# Patient Record
Sex: Female | Born: 1990 | Race: White | Hispanic: No | Marital: Single | Smoking: Never smoker
Health system: Southern US, Community
[De-identification: ages and names within clinical notes are randomized; demographics above are authoritative.]

## PROBLEM LIST (undated history)

## (undated) DIAGNOSIS — Z789 Other specified health status: Secondary | ICD-10-CM

## (undated) HISTORY — DX: Other specified health status: Z78.9

## (undated) HISTORY — PX: NO PAST SURGERIES: SHX2092

---
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
## 2011-01-29 ENCOUNTER — Emergency Department (HOSPITAL_COMMUNITY)
Admission: EM | Admit: 2011-01-29 | Discharge: 2011-01-29 | Disposition: A | Discharge status: Home / Self Care | Payer: Self-pay | Attending: Emergency Medicine | Admitting: Emergency Medicine

## 2011-01-29 DIAGNOSIS — H5710 Ocular pain, unspecified eye: Secondary | ICD-10-CM | POA: Insufficient documentation

## 2011-01-29 DIAGNOSIS — Y92009 Unspecified place in unspecified non-institutional (private) residence as the place of occurrence of the external cause: Secondary | ICD-10-CM | POA: Insufficient documentation

## 2011-01-29 DIAGNOSIS — IMO0002 Reserved for concepts with insufficient information to code with codable children: Secondary | ICD-10-CM | POA: Insufficient documentation

## 2011-01-29 DIAGNOSIS — S058X9A Other injuries of unspecified eye and orbit, initial encounter: Secondary | ICD-10-CM | POA: Insufficient documentation

## 2013-05-17 ENCOUNTER — Other Ambulatory Visit: Payer: Self-pay | Admitting: *Deleted

## 2013-05-17 ENCOUNTER — Encounter: Payer: Self-pay | Admitting: Advanced Practice Midwife

## 2013-05-17 ENCOUNTER — Ambulatory Visit (INDEPENDENT_AMBULATORY_CARE_PROVIDER_SITE_OTHER): Payer: Medicaid Other | Admitting: Advanced Practice Midwife

## 2013-05-17 VITALS — BP 113/74 | Temp 98.7°F | Ht 61.0 in | Wt 118.2 lb

## 2013-05-17 DIAGNOSIS — Z3400 Encounter for supervision of normal first pregnancy, unspecified trimester: Secondary | ICD-10-CM | POA: Insufficient documentation

## 2013-05-17 DIAGNOSIS — Z3401 Encounter for supervision of normal first pregnancy, first trimester: Secondary | ICD-10-CM

## 2013-05-17 DIAGNOSIS — Z3201 Encounter for pregnancy test, result positive: Secondary | ICD-10-CM

## 2013-05-17 LAB — POCT URINALYSIS DIP (DEVICE)
Bilirubin Urine: NEGATIVE
Ketones, ur: NEGATIVE mg/dL
pH: 7 (ref 5.0–8.0)

## 2013-05-17 LAB — US OB LIMITED

## 2013-05-17 NOTE — Patient Instructions (Signed)
Pregnancy - First Trimester  During sexual intercourse, millions of sperm go into the vagina. Only 1 sperm will penetrate and fertilize the female egg while it is in the Fallopian tube. One week later, the fertilized egg implants into the wall of the uterus. An embryo begins to develop into a baby. At 6 to 8 weeks, the eyes and face are formed and the heartbeat can be seen on ultrasound. At the end of 12 weeks (first trimester), all the baby's organs are formed. Now that you are pregnant, you will want to do everything you can to have a healthy baby. Two of the most important things are to get good prenatal care and follow your caregiver's instructions. Prenatal care is all the medical care you receive before the baby's birth. It is given to prevent, find, and treat problems during the pregnancy and childbirth.  PRENATAL EXAMS  · During prenatal visits, your weight, blood pressure, and urine are checked. This is done to make sure you are healthy and progressing normally during the pregnancy.  · A pregnant woman should gain 25 to 35 pounds during the pregnancy. However, if you are overweight or underweight, your caregiver will advise you regarding your weight.  · Your caregiver will ask and answer questions for you.  · Blood work, cervical cultures, other necessary tests, and a Pap test are done during your prenatal exams. These tests are done to check on your health and the probable health of your baby. Tests are strongly recommended and done for HIV with your permission. This is the virus that causes AIDS. These tests are done because medicines can be given to help prevent your baby from being born with this infection should you have been infected without knowing it. Blood work is also used to find out your blood type, previous infections, and follow your blood levels (hemoglobin).  · Low hemoglobin (anemia) is common during pregnancy. Iron and vitamins are given to help prevent this. Later in the pregnancy, blood  tests for diabetes will be done along with any other tests if any problems develop.  · You may need other tests to make sure you and the baby are doing well.  CHANGES DURING THE FIRST TRIMESTER   Your body goes through many changes during pregnancy. They vary from person to person. Talk to your caregiver about changes you notice and are concerned about. Changes can include:  · Your menstrual period stops.  · The egg and sperm carry the genes that determine what you look like. Genes from you and your partner are forming a baby. The female genes determine whether the baby is a boy or a girl.  · Your body increases in girth and you may feel bloated.  · Feeling sick to your stomach (nauseous) and throwing up (vomiting). If the vomiting is uncontrollable, call your caregiver.  · Your breasts will begin to enlarge and become tender.  · Your nipples may stick out more and become darker.  · The need to urinate more. Painful urination may mean you have a bladder infection.  · Tiring easily.  · Loss of appetite.  · Cravings for certain kinds of food.  · At first, you may gain or lose a couple of pounds.  · You may have changes in your emotions from day to day (excited to be pregnant or concerned something may go wrong with the pregnancy and baby).  · You may have more vivid and strange dreams.  HOME CARE INSTRUCTIONS   ·   It is very important to avoid all smoking, alcohol and non-prescribed drugs during your pregnancy. These affect the formation and growth of the baby. Avoid chemicals while pregnant to ensure the delivery of a healthy infant.  · Start your prenatal visits by the 12th week of pregnancy. They are usually scheduled monthly at first, then more often in the last 2 months before delivery. Keep your caregiver's appointments. Follow your caregiver's instructions regarding medicine use, blood and lab tests, exercise, and diet.  · During pregnancy, you are providing food for you and your baby. Eat regular, well-balanced  meals. Choose foods such as meat, fish, milk and other low fat dairy products, vegetables, fruits, and whole-grain breads and cereals. Your caregiver will tell you of the ideal weight gain.  · You can help morning sickness by keeping soda crackers at the bedside. Eat a couple before arising in the morning. You may want to use the crackers without salt on them.  · Eating 4 to 5 small meals rather than 3 large meals a day also may help the nausea and vomiting.  · Drinking liquids between meals instead of during meals also seems to help nausea and vomiting.  · A physical sexual relationship may be continued throughout pregnancy if there are no other problems. Problems may be early (premature) leaking of amniotic fluid from the membranes, vaginal bleeding, or belly (abdominal) pain.  · Exercise regularly if there are no restrictions. Check with your caregiver or physical therapist if you are unsure of the safety of some of your exercises. Greater weight gain will occur in the last 2 trimesters of pregnancy. Exercising will help:  · Control your weight.  · Keep you in shape.  · Prepare you for labor and delivery.  · Help you lose your pregnancy weight after you deliver your baby.  · Wear a good support or jogging bra for breast tenderness during pregnancy. This may help if worn during sleep too.  · Ask when prenatal classes are available. Begin classes when they are offered.  · Do not use hot tubs, steam rooms, or saunas.  · Wear your seat belt when driving. This protects you and your baby if you are in an accident.  · Avoid raw meat, uncooked cheese, cat litter boxes, and soil used by cats throughout the pregnancy. These carry germs that can cause birth defects in the baby.  · The first trimester is a good time to visit your dentist for your dental health. Getting your teeth cleaned is okay. Use a softer toothbrush and brush gently during pregnancy.  · Ask for help if you have financial, counseling, or nutritional needs  during pregnancy. Your caregiver will be able to offer counseling for these needs as well as refer you for other special needs.  · Do not take any medicines or herbs unless told by your caregiver.  · Inform your caregiver if there is any mental or physical domestic violence.  · Make a list of emergency phone numbers of family, friends, hospital, and police and fire departments.  · Write down your questions. Take them to your prenatal visit.  · Do not douche.  · Do not cross your legs.  · If you have to stand for long periods of time, rotate you feet or take small steps in a circle.  · You may have more vaginal secretions that may require a sanitary pad. Do not use tampons or scented sanitary pads.  MEDICINES AND DRUG USE IN PREGNANCY  ·   Take prenatal vitamins as directed. The vitamin should contain 1 milligram of folic acid. Keep all vitamins out of reach of children. Only a couple vitamins or tablets containing iron may be fatal to a baby or young child when ingested.  · Avoid use of all medicines, including herbs, over-the-counter medicines, not prescribed or suggested by your caregiver. Only take over-the-counter or prescription medicines for pain, discomfort, or fever as directed by your caregiver. Do not use aspirin, ibuprofen, or naproxen unless directed by your caregiver.  · Let your caregiver also know about herbs you may be using.  · Alcohol is related to a number of birth defects. This includes fetal alcohol syndrome. All alcohol, in any form, should be avoided completely. Smoking will cause low birth rate and premature babies.  · Street or illegal drugs are very harmful to the baby. They are absolutely forbidden. A baby born to an addicted mother will be addicted at birth. The baby will go through the same withdrawal an adult does.  · Let your caregiver know about any medicines that you have to take and for what reason you take them.  SEEK MEDICAL CARE IF:   You have any concerns or worries during your  pregnancy. It is better to call with your questions if you feel they cannot wait, rather than worry about them.  SEEK IMMEDIATE MEDICAL CARE IF:   · An unexplained oral temperature above 102° F (38.9° C) develops, or as your caregiver suggests.  · You have leaking of fluid from the vagina (birth canal). If leaking membranes are suspected, take your temperature and inform your caregiver of this when you call.  · There is vaginal spotting or bleeding. Notify your caregiver of the amount and how many pads are used.  · You develop a bad smelling vaginal discharge with a change in the color.  · You continue to feel sick to your stomach (nauseated) and have no relief from remedies suggested. You vomit blood or coffee ground-like materials.  · You lose more than 2 pounds of weight in 1 week.  · You gain more than 2 pounds of weight in 1 week and you notice swelling of your face, hands, feet, or legs.  · You gain 5 pounds or more in 1 week (even if you do not have swelling of your hands, face, legs, or feet).  · You get exposed to German measles and have never had them.  · You are exposed to fifth disease or chickenpox.  · You develop belly (abdominal) pain. Round ligament discomfort is a common non-cancerous (benign) cause of abdominal pain in pregnancy. Your caregiver still must evaluate this.  · You develop headache, fever, diarrhea, pain with urination, or shortness of breath.  · You fall or are in a car accident or have any kind of trauma.  · There is mental or physical violence in your home.  Document Released: 10/18/2001 Document Revised: 07/18/2012 Document Reviewed: 04/21/2009  [employer]® Patient Information ©2014 [employer].

## 2013-05-17 NOTE — Progress Notes (Signed)
Pulse: 74 Discussed recommended weight gain of 25-35 lb.

## 2013-05-17 NOTE — Progress Notes (Signed)
Informal [place] for viability- +FM, +cardiac, FHR = 173 per PW doppler.  First screen scheduled 7/23.

## 2013-05-17 NOTE — Progress Notes (Signed)
   Subjective:    Kelly Collier is a G1P0 [redacted]w[redacted]d being seen today for her first obstetrical visit.  Her obstetrical history is significant for unplanned pregnancy. Patient does intend to breast feed. Pregnancy history fully reviewed.  Patient reports no complaints.  Filed Vitals:   05/17/13 0835 05/17/13 0844  BP: 113/74   Temp: 98.7 F (37.1 C)   Height:  5\' 1"  (1.549 m)  Weight: 53.615 kg (118 lb 3.2 oz)     HISTORY: OB History   Grav Para Term Preterm Abortions TAB SAB Ect Mult Living   1              # Outc Date GA Lbr Len/2nd Wgt Sex Del Anes PTL Lv   1 CUR              Past Medical History  Diagnosis Date  . Medical history non-contributory    Past Surgical History  Procedure Laterality Date  . No past surgeries     Family History  Problem Relation Age of Onset  . Diabetes Father   . Heart disease Father   . Heart disease Brother      Exam    Uterus:     Pelvic Exam:    Perineum: No Hemorrhoids   Vulva: Bartholin's, Urethra, Skene's normal   Vagina:  normal mucosa, normal discharge   pH:    Cervix: no bleeding following Pap, no cervical motion tenderness, no lesions and nulliparous appearance   Adnexa: normal adnexa and no mass, fullness, tenderness   Bony Pelvis: gynecoid  System: Breast:  normal appearance, no masses or tenderness   Skin: normal coloration and turgor, no rashes    Neurologic: oriented, normal, grossly non-focal   Extremities: normal strength, tone, and muscle mass   HEENT neck supple with midline trachea   Mouth/Teeth mucous membranes moist, pharynx normal without lesions   Neck supple and no masses   Cardiovascular: regular rate and rhythm   Respiratory:  appears well, vitals normal, no respiratory distress, acyanotic, normal RR, ear and throat exam is normal, neck free of mass or lymphadenopathy, chest clear, no wheezing, crepitations, rhonchi, normal symmetric air entry   Abdomen: soft, non-tender; bowel sounds normal; no  masses,  no organomegaly   Urinary: urethral meatus normal      Assessment:    Pregnancy: G1P0 There are no active problems to display for this patient.       Plan:     Initial labs drawn. Prenatal vitamins. Problem list reviewed and updated. Genetic Screening discussed First Screen: ordered.  Ultrasound discussed; fetal survey: ordered.  Follow up in 4 weeks. 50% of 30 min visit spent on counseling and coordination of care.  Unable to hear FHTs so US done to verify viability and FHR was seen   [facility] 05/17/2013

## 2013-05-18 ENCOUNTER — Encounter: Payer: Self-pay | Admitting: Obstetrics & Gynecology

## 2013-05-18 LAB — OBSTETRIC PANEL
Antibody Screen: NEGATIVE
Basophils Absolute: 0.1 10*3/uL (ref 0.0–0.1)
Basophils Relative: 1 % (ref 0–1)
HCT: 33.4 % — ABNORMAL LOW (ref 36.0–46.0)
Hemoglobin: 11.4 g/dL — ABNORMAL LOW (ref 12.0–15.0)
MCHC: 34.1 g/dL (ref 30.0–36.0)
Monocytes Absolute: 0.6 10*3/uL (ref 0.1–1.0)
Neutro Abs: 8.9 10*3/uL — ABNORMAL HIGH (ref 1.7–7.7)
Neutrophils Relative %: 77 % (ref 43–77)
Platelets: 215 10*3/uL (ref 150–400)
RBC: 3.78 MIL/uL — ABNORMAL LOW (ref 3.87–5.11)
RDW: 12.8 % (ref 11.5–15.5)
WBC: 11.4 10*3/uL — ABNORMAL HIGH (ref 4.0–10.5)

## 2013-05-18 LAB — CULTURE, OB URINE: Colony Count: 4000

## 2013-05-18 LAB — HIV ANTIBODY (ROUTINE TESTING W REFLEX): HIV: NONREACTIVE

## 2013-05-27 ENCOUNTER — Encounter: Payer: Self-pay | Admitting: Advanced Practice Midwife

## 2013-05-27 DIAGNOSIS — IMO0002 Reserved for concepts with insufficient information to code with codable children: Secondary | ICD-10-CM | POA: Insufficient documentation

## 2013-05-28 ENCOUNTER — Other Ambulatory Visit: Payer: Self-pay | Admitting: Advanced Practice Midwife

## 2013-05-28 DIAGNOSIS — Z3682 Encounter for antenatal screening for nuchal translucency: Secondary | ICD-10-CM

## 2013-05-29 ENCOUNTER — Telehealth: Payer: Self-pay | Admitting: General Practice

## 2013-05-29 ENCOUNTER — Other Ambulatory Visit: Payer: Self-pay

## 2013-05-29 ENCOUNTER — Ambulatory Visit (HOSPITAL_COMMUNITY)
Admission: RE | Admit: 2013-05-29 | Discharge: 2013-05-29 | Disposition: A | Discharge status: Home / Self Care | Payer: Medicaid Other | Source: Ambulatory Visit | Attending: Advanced Practice Midwife | Admitting: Advanced Practice Midwife

## 2013-05-29 DIAGNOSIS — O3510X0 Maternal care for (suspected) chromosomal abnormality in fetus, unspecified, not applicable or unspecified: Secondary | ICD-10-CM | POA: Insufficient documentation

## 2013-05-29 DIAGNOSIS — Z3689 Encounter for other specified antenatal screening: Secondary | ICD-10-CM | POA: Insufficient documentation

## 2013-05-29 DIAGNOSIS — O351XX0 Maternal care for (suspected) chromosomal abnormality in fetus, not applicable or unspecified: Secondary | ICD-10-CM | POA: Insufficient documentation

## 2013-05-29 DIAGNOSIS — Z3682 Encounter for antenatal screening for nuchal translucency: Secondary | ICD-10-CM

## 2013-05-29 NOTE — Telephone Encounter (Addendum)
Called patient no answer- left message to call us back at the clinics  **Colpo added to next Ob fu appt on 8/4 @ 0915.   Diane Day RNC

## 2013-05-29 NOTE — Progress Notes (Signed)
[facility] ultrasound  Indication: 23 yr old G1P0 at [redacted]w[redacted]d for first trimester screen.  Findings: 1. Single intrauterine pregnancy. 2. Fetal crown rump length is consistent with dating. 3. Normal uterus; no adnexal masses seen. 4. Evaluation of fetal anatomy is limited by early gestational age. 5. Normal nuchal translucency measuring 1.40mm. 6. The nasal bone is visualized.  Recommendations: 1. First trimester screen done today: discussed limitations in detecting fetal aneuploidy. 2. Recommend maternal serum AFP at 15-[redacted] weeks gestation. 3. Recommend fetal anatomic survey at 18-[redacted] weeks gestation.  Eulis Foster, MD

## 2013-05-29 NOTE — Telephone Encounter (Signed)
Message copied by Kathee Delton on Wed May 29, 2013  5:18 PM ------      Message from: [place], [place] L      Created: Mon May 27, 2013  8:26 AM      Regarding: abnormal pap       ASCUS cannot exclude High Grade            Needs colpo            HR HPV +            Can you give her results and schedule colpo?            Thanks      marie ------

## 2013-05-30 NOTE — Telephone Encounter (Signed)
Spoke to patient, gave pap result and that she will be needing a colposocpy. Procedure added on next OB appt on the 06/10/13 @0915 . Patient agrees and satisfied.

## 2013-05-31 ENCOUNTER — Encounter: Payer: Self-pay | Admitting: Advanced Practice Midwife

## 2013-06-06 ENCOUNTER — Encounter: Payer: Self-pay | Admitting: *Deleted

## 2013-06-10 ENCOUNTER — Ambulatory Visit (INDEPENDENT_AMBULATORY_CARE_PROVIDER_SITE_OTHER): Payer: Medicaid Other | Admitting: Family Medicine

## 2013-06-10 ENCOUNTER — Encounter: Payer: Self-pay | Admitting: Family Medicine

## 2013-06-10 VITALS — BP 134/88 | Temp 97.8°F | Wt 119.5 lb

## 2013-06-10 DIAGNOSIS — Z3400 Encounter for supervision of normal first pregnancy, unspecified trimester: Secondary | ICD-10-CM

## 2013-06-10 DIAGNOSIS — IMO0002 Reserved for concepts with insufficient information to code with codable children: Secondary | ICD-10-CM

## 2013-06-10 DIAGNOSIS — R87810 Cervical high risk human papillomavirus (HPV) DNA test positive: Secondary | ICD-10-CM

## 2013-06-10 DIAGNOSIS — Z3401 Encounter for supervision of normal first pregnancy, first trimester: Secondary | ICD-10-CM

## 2013-06-10 DIAGNOSIS — R87610 Atypical squamous cells of undetermined significance on cytologic smear of cervix (ASC-US): Secondary | ICD-10-CM

## 2013-06-10 LAB — POCT URINALYSIS DIP (DEVICE)
Bilirubin Urine: NEGATIVE
Glucose, UA: 100 mg/dL — AB
Ketones, ur: NEGATIVE mg/dL
Nitrite: NEGATIVE
pH: 5.5 (ref 5.0–8.0)

## 2013-06-10 NOTE — Progress Notes (Signed)
Pulse: 99 

## 2013-06-10 NOTE — Progress Notes (Signed)
22 yo G1 @ [redacted]w[redacted]d - doing well. No complaints - no nausea, ctx, vb, lof.  - scheduled today for colpo   O: see above  A/P:   1) ROB - doing well - symptoms improving - discussed return precautions and visit schedule at this stage of pregnancy.  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  colpo not indicated. This was discussed with the pt.    3) f/u in 4 weeks.

## 2013-06-10 NOTE — Patient Instructions (Addendum)
Pregnancy - Second Trimester The second trimester of pregnancy (3 to 6 months) is a period of rapid growth for you and your baby. At the end of the sixth month, your baby is about 9 inches long and weighs 1 1/2 pounds. You will begin to feel the baby move between 18 and 20 weeks of the pregnancy. This is called quickening. Weight gain is faster. A clear fluid (colostrum) may leak out of your breasts. You may feel small contractions of the womb (uterus). This is known as false labor or Braxton-Hicks contractions. This is like a practice for labor when the baby is ready to be born. Usually, the problems with morning sickness have usually passed by the end of your first trimester. Some women develop small dark blotches (called cholasma, mask of pregnancy) on their face that usually goes away after the baby is born. Exposure to the sun makes the blotches worse. Acne may also develop in some pregnant women and pregnant women who have acne, may find that it goes away. PRENATAL EXAMS  Blood work may continue to be done during prenatal exams. These tests are done to check on your health and the probable health of your baby. Blood work is used to follow your blood levels (hemoglobin). Anemia (low hemoglobin) is common during pregnancy. Iron and vitamins are given to help prevent this. You will also be checked for diabetes between 24 and 28 weeks of the pregnancy. Some of the previous blood tests may be repeated.  The size of the uterus is measured during each visit. This is to make sure that the baby is continuing to grow properly according to the dates of the pregnancy.  Your blood pressure is checked every prenatal visit. This is to make sure you are not getting toxemia.  Your urine is checked to make sure you do not have an infection, diabetes or protein in the urine.  Your weight is checked often to make sure gains are happening at the suggested rate. This is to ensure that both you and your baby are  growing normally.  Sometimes, an ultrasound is performed to confirm the proper growth and development of the baby. This is a test which bounces harmless sound waves off the baby so your caregiver can more accurately determine due dates. Sometimes, a test is done on the amniotic fluid surrounding the baby. This test is called an amniocentesis. The amniotic fluid is obtained by sticking a needle into the belly (abdomen). This is done to check the chromosomes in instances where there is a concern about possible genetic problems with the baby. It is also sometimes done near the end of pregnancy if an early delivery is required. In this case, it is done to help make sure the baby's lungs are mature enough for the baby to live outside of the womb. CHANGES OCCURING IN THE SECOND TRIMESTER OF PREGNANCY Your body goes through many changes during pregnancy. They vary from person to person. Talk to your caregiver about changes you notice that you are concerned about.  During the second trimester, you will likely have an increase in your appetite. It is normal to have cravings for certain foods. This varies from person to person and pregnancy to pregnancy.  Your lower abdomen will begin to bulge.  You may have to urinate more often because the uterus and baby are pressing on your bladder. It is also common to get more bladder infections during pregnancy. You can help this by drinking lots of fluids   and emptying your bladder before and after intercourse.  You may begin to get stretch marks on your hips, abdomen, and breasts. These are normal changes in the body during pregnancy. There are no exercises or medicines to take that prevent this change.  You may begin to develop swollen and bulging veins (varicose veins) in your legs. Wearing support hose, elevating your feet for 15 minutes, 3 to 4 times a day and limiting salt in your diet helps lessen the problem.  Heartburn may develop as the uterus grows and  pushes up against the stomach. Antacids recommended by your caregiver helps with this problem. Also, eating smaller meals 4 to 5 times a day helps.  Constipation can be treated with a stool softener or adding bulk to your diet. Drinking lots of fluids, and eating vegetables, fruits, and whole grains are helpful.  Exercising is also helpful. If you have been very active up until your pregnancy, most of these activities can be continued during your pregnancy. If you have been less active, it is helpful to start an exercise program such as walking.  Hemorrhoids may develop at the end of the second trimester. Warm sitz baths and hemorrhoid cream recommended by your caregiver helps hemorrhoid problems.  Backaches may develop during this time of your pregnancy. Avoid heavy lifting, wear low heal shoes, and practice good posture to help with backache problems.  Some pregnant women develop tingling and numbness of their hand and fingers because of swelling and tightening of ligaments in the wrist (carpel tunnel syndrome). This goes away after the baby is born.  As your breasts enlarge, you may have to get a bigger bra. Get a comfortable, cotton, support bra. Do not get a nursing bra until the last month of the pregnancy if you will be nursing the baby.  You may get a dark line from your belly button to the pubic area called the linea nigra.  You may develop rosy cheeks because of increase blood flow to the face.  You may develop spider looking lines of the face, neck, arms, and chest. These go away after the baby is born. HOME CARE INSTRUCTIONS   It is extremely important to avoid all smoking, herbs, alcohol, and unprescribed drugs during your pregnancy. These chemicals affect the formation and growth of the baby. Avoid these chemicals throughout the pregnancy to ensure the delivery of a healthy infant.  Most of your home care instructions are the same as suggested for the first trimester of your  pregnancy. Keep your caregiver's appointments. Follow your caregiver's instructions regarding medicine use, exercise, and diet.  During pregnancy, you are providing food for you and your baby. Continue to eat regular, well-balanced meals. Choose foods such as meat, fish, milk and other low fat dairy products, vegetables, fruits, and whole-grain breads and cereals. Your caregiver will tell you of the ideal weight gain.  A physical sexual relationship may be continued up until near the end of pregnancy if there are no other problems. Problems could include early (premature) leaking of amniotic fluid from the membranes, vaginal bleeding, abdominal pain, or other medical or pregnancy problems.  Exercise regularly if there are no restrictions. Check with your caregiver if you are unsure of the safety of some of your exercises. The greatest weight gain will occur in the last 2 trimesters of pregnancy. Exercise will help you:  Control your weight.  Get you in shape for labor and delivery.  Lose weight after you have the baby.  Wear   a good support or jogging bra for breast tenderness during pregnancy. This may help if worn during sleep. Pads or tissues may be used in the bra if you are leaking colostrum.  Do not use hot tubs, steam rooms or saunas throughout the pregnancy.  Wear your seat belt at all times when driving. This protects you and your baby if you are in an accident.  Avoid raw meat, uncooked cheese, cat litter boxes, and soil used by cats. These carry germs that can cause birth defects in the baby.  The second trimester is also a good time to visit your dentist for your dental health if this has not been done yet. Getting your teeth cleaned is okay. Use a soft toothbrush. Brush gently during pregnancy.  It is easier to leak urine during pregnancy. Tightening up and strengthening the pelvic muscles will help with this problem. Practice stopping your urination while you are going to the  bathroom. These are the same muscles you need to strengthen. It is also the muscles you would use as if you were trying to stop from passing gas. You can practice tightening these muscles up 10 times a set and repeating this about 3 times per day. Once you know what muscles to tighten up, do not perform these exercises during urination. It is more likely to contribute to an infection by backing up the urine.  Ask for help if you have financial, counseling, or nutritional needs during pregnancy. Your caregiver will be able to offer counseling for these needs as well as refer you for other special needs.  Your skin may become oily. If so, wash your face with mild soap, use non-greasy moisturizer and oil or cream based makeup. MEDICINES AND DRUG USE IN PREGNANCY  Take prenatal vitamins as directed. The vitamin should contain 1 milligram of folic acid. Keep all vitamins out of reach of children. Only a couple vitamins or tablets containing iron may be fatal to a baby or young child when ingested.  Avoid use of all medicines, including herbs, over-the-counter medicines, not prescribed or suggested by your caregiver. Only take over-the-counter or prescription medicines for pain, discomfort, or fever as directed by your caregiver. Do not use aspirin.  Let your caregiver also know about herbs you may be using.  Alcohol is related to a number of birth defects. This includes fetal alcohol syndrome. All alcohol, in any form, should be avoided completely. Smoking will cause low birth rate and premature babies.  Street or illegal drugs are very harmful to the baby. They are absolutely forbidden. A baby born to an addicted mother will be addicted at birth. The baby will go through the same withdrawal an adult does. SEEK MEDICAL CARE IF:  You have any concerns or worries during your pregnancy. It is better to call with your questions if you feel they cannot wait, rather than worry about them. SEEK IMMEDIATE  MEDICAL CARE IF:   An unexplained oral temperature above 102 F (38.9 C) develops, or as your caregiver suggests.  You have leaking of fluid from the vagina (birth canal). If leaking membranes are suspected, take your temperature and tell your caregiver of this when you call.  There is vaginal spotting, bleeding, or passing clots. Tell your caregiver of the amount and how many pads are used. Light spotting in pregnancy is common, especially following intercourse.  You develop a bad smelling vaginal discharge with a change in the color from clear to white.  You continue to feel   sick to your stomach (nauseated) and have no relief from remedies suggested. You vomit blood or coffee ground-like materials.  You lose more than 2 pounds of weight or gain more than 2 pounds of weight over 1 week, or as suggested by your caregiver.  You notice swelling of your face, hands, feet, or legs.  You get exposed to German measles and have never had them.  You are exposed to fifth disease or chickenpox.  You develop belly (abdominal) pain. Round ligament discomfort is a common non-cancerous (benign) cause of abdominal pain in pregnancy. Your caregiver still must evaluate you.  You develop a bad headache that does not go away.  You develop fever, diarrhea, pain with urination, or shortness of breath.  You develop visual problems, blurry, or double vision.  You fall or are in a car accident or any kind of trauma.  There is mental or physical violence at home. Document Released: 10/18/2001 Document Revised: 07/18/2012 Document Reviewed: 04/22/2009 [employer] Patient Information 2014 [employer].  

## 2013-07-10 ENCOUNTER — Ambulatory Visit (INDEPENDENT_AMBULATORY_CARE_PROVIDER_SITE_OTHER): Payer: Medicaid Other | Admitting: Family Medicine

## 2013-07-10 VITALS — BP 129/76 | Temp 98.7°F | Wt 123.6 lb

## 2013-07-10 DIAGNOSIS — Z3400 Encounter for supervision of normal first pregnancy, unspecified trimester: Secondary | ICD-10-CM

## 2013-07-10 DIAGNOSIS — Z3401 Encounter for supervision of normal first pregnancy, first trimester: Secondary | ICD-10-CM

## 2013-07-10 LAB — POCT URINALYSIS DIP (DEVICE)
Bilirubin Urine: NEGATIVE
Glucose, UA: NEGATIVE mg/dL
Ketones, ur: NEGATIVE mg/dL
Protein, ur: NEGATIVE mg/dL
Specific Gravity, Urine: 1.01 (ref 1.005–1.030)

## 2013-07-10 NOTE — Progress Notes (Signed)
P=[id number],  

## 2013-07-10 NOTE — Progress Notes (Signed)
22 yo G1 @[redacted]w[redacted]d  here for routine f/u  No ctx, no bleeding, LOF. Doing well overall.   O: see flowsheet  A/P  - doing well, no concerns - anatomy US ordered today - return precautions discussed -f/u in 4 weeks -anatomy US scheduled

## 2013-07-10 NOTE — Patient Instructions (Signed)
Pregnancy - Second Trimester The second trimester of pregnancy (3 to 6 months) is a period of rapid growth for you and your baby. At the end of the sixth month, your baby is about 9 inches long and weighs 1 1/2 pounds. You will begin to feel the baby move between 18 and 20 weeks of the pregnancy. This is called quickening. Weight gain is faster. A clear fluid (colostrum) may leak out of your breasts. You may feel small contractions of the womb (uterus). This is known as false labor or Braxton-Hicks contractions. This is like a practice for labor when the baby is ready to be born. Usually, the problems with morning sickness have usually passed by the end of your first trimester. Some women develop small dark blotches (called cholasma, mask of pregnancy) on their face that usually goes away after the baby is born. Exposure to the sun makes the blotches worse. Acne may also develop in some pregnant women and pregnant women who have acne, may find that it goes away. PRENATAL EXAMS  Blood work may continue to be done during prenatal exams. These tests are done to check on your health and the probable health of your baby. Blood work is used to follow your blood levels (hemoglobin). Anemia (low hemoglobin) is common during pregnancy. Iron and vitamins are given to help prevent this. You will also be checked for diabetes between 24 and 28 weeks of the pregnancy. Some of the previous blood tests may be repeated.  The size of the uterus is measured during each visit. This is to make sure that the baby is continuing to grow properly according to the dates of the pregnancy.  Your blood pressure is checked every prenatal visit. This is to make sure you are not getting toxemia.  Your urine is checked to make sure you do not have an infection, diabetes or protein in the urine.  Your weight is checked often to make sure gains are happening at the suggested rate. This is to ensure that both you and your baby are  growing normally.  Sometimes, an ultrasound is performed to confirm the proper growth and development of the baby. This is a test which bounces harmless sound waves off the baby so your caregiver can more accurately determine due dates. Sometimes, a test is done on the amniotic fluid surrounding the baby. This test is called an amniocentesis. The amniotic fluid is obtained by sticking a needle into the belly (abdomen). This is done to check the chromosomes in instances where there is a concern about possible genetic problems with the baby. It is also sometimes done near the end of pregnancy if an early delivery is required. In this case, it is done to help make sure the baby's lungs are mature enough for the baby to live outside of the womb. CHANGES OCCURING IN THE SECOND TRIMESTER OF PREGNANCY Your body goes through many changes during pregnancy. They vary from person to person. Talk to your caregiver about changes you notice that you are concerned about.  During the second trimester, you will likely have an increase in your appetite. It is normal to have cravings for certain foods. This varies from person to person and pregnancy to pregnancy.  Your lower abdomen will begin to bulge.  You may have to urinate more often because the uterus and baby are pressing on your bladder. It is also common to get more bladder infections during pregnancy. You can help this by drinking lots of fluids   and emptying your bladder before and after intercourse.  You may begin to get stretch marks on your hips, abdomen, and breasts. These are normal changes in the body during pregnancy. There are no exercises or medicines to take that prevent this change.  You may begin to develop swollen and bulging veins (varicose veins) in your legs. Wearing support hose, elevating your feet for 15 minutes, 3 to 4 times a day and limiting salt in your diet helps lessen the problem.  Heartburn may develop as the uterus grows and  pushes up against the stomach. Antacids recommended by your caregiver helps with this problem. Also, eating smaller meals 4 to 5 times a day helps.  Constipation can be treated with a stool softener or adding bulk to your diet. Drinking lots of fluids, and eating vegetables, fruits, and whole grains are helpful.  Exercising is also helpful. If you have been very active up until your pregnancy, most of these activities can be continued during your pregnancy. If you have been less active, it is helpful to start an exercise program such as walking.  Hemorrhoids may develop at the end of the second trimester. Warm sitz baths and hemorrhoid cream recommended by your caregiver helps hemorrhoid problems.  Backaches may develop during this time of your pregnancy. Avoid heavy lifting, wear low heal shoes, and practice good posture to help with backache problems.  Some pregnant women develop tingling and numbness of their hand and fingers because of swelling and tightening of ligaments in the wrist (carpel tunnel syndrome). This goes away after the baby is born.  As your breasts enlarge, you may have to get a bigger bra. Get a comfortable, cotton, support bra. Do not get a nursing bra until the last month of the pregnancy if you will be nursing the baby.  You may get a dark line from your belly button to the pubic area called the linea nigra.  You may develop rosy cheeks because of increase blood flow to the face.  You may develop spider looking lines of the face, neck, arms, and chest. These go away after the baby is born. HOME CARE INSTRUCTIONS   It is extremely important to avoid all smoking, herbs, alcohol, and unprescribed drugs during your pregnancy. These chemicals affect the formation and growth of the baby. Avoid these chemicals throughout the pregnancy to ensure the delivery of a healthy infant.  Most of your home care instructions are the same as suggested for the first trimester of your  pregnancy. Keep your caregiver's appointments. Follow your caregiver's instructions regarding medicine use, exercise, and diet.  During pregnancy, you are providing food for you and your baby. Continue to eat regular, well-balanced meals. Choose foods such as meat, fish, milk and other low fat dairy products, vegetables, fruits, and whole-grain breads and cereals. Your caregiver will tell you of the ideal weight gain.  A physical sexual relationship may be continued up until near the end of pregnancy if there are no other problems. Problems could include early (premature) leaking of amniotic fluid from the membranes, vaginal bleeding, abdominal pain, or other medical or pregnancy problems.  Exercise regularly if there are no restrictions. Check with your caregiver if you are unsure of the safety of some of your exercises. The greatest weight gain will occur in the last 2 trimesters of pregnancy. Exercise will help you:  Control your weight.  Get you in shape for labor and delivery.  Lose weight after you have the baby.  Wear   a good support or jogging bra for breast tenderness during pregnancy. This may help if worn during sleep. Pads or tissues may be used in the bra if you are leaking colostrum.  Do not use hot tubs, steam rooms or saunas throughout the pregnancy.  Wear your seat belt at all times when driving. This protects you and your baby if you are in an accident.  Avoid raw meat, uncooked cheese, cat litter boxes, and soil used by cats. These carry germs that can cause birth defects in the baby.  The second trimester is also a good time to visit your dentist for your dental health if this has not been done yet. Getting your teeth cleaned is okay. Use a soft toothbrush. Brush gently during pregnancy.  It is easier to leak urine during pregnancy. Tightening up and strengthening the pelvic muscles will help with this problem. Practice stopping your urination while you are going to the  bathroom. These are the same muscles you need to strengthen. It is also the muscles you would use as if you were trying to stop from passing gas. You can practice tightening these muscles up 10 times a set and repeating this about 3 times per day. Once you know what muscles to tighten up, do not perform these exercises during urination. It is more likely to contribute to an infection by backing up the urine.  Ask for help if you have financial, counseling, or nutritional needs during pregnancy. Your caregiver will be able to offer counseling for these needs as well as refer you for other special needs.  Your skin may become oily. If so, wash your face with mild soap, use non-greasy moisturizer and oil or cream based makeup. MEDICINES AND DRUG USE IN PREGNANCY  Take prenatal vitamins as directed. The vitamin should contain 1 milligram of folic acid. Keep all vitamins out of reach of children. Only a couple vitamins or tablets containing iron may be fatal to a baby or young child when ingested.  Avoid use of all medicines, including herbs, over-the-counter medicines, not prescribed or suggested by your caregiver. Only take over-the-counter or prescription medicines for pain, discomfort, or fever as directed by your caregiver. Do not use aspirin.  Let your caregiver also know about herbs you may be using.  Alcohol is related to a number of birth defects. This includes fetal alcohol syndrome. All alcohol, in any form, should be avoided completely. Smoking will cause low birth rate and premature babies.  Street or illegal drugs are very harmful to the baby. They are absolutely forbidden. A baby born to an addicted mother will be addicted at birth. The baby will go through the same withdrawal an adult does. SEEK MEDICAL CARE IF:  You have any concerns or worries during your pregnancy. It is better to call with your questions if you feel they cannot wait, rather than worry about them. SEEK IMMEDIATE  MEDICAL CARE IF:   An unexplained oral temperature above 102 F (38.9 C) develops, or as your caregiver suggests.  You have leaking of fluid from the vagina (birth canal). If leaking membranes are suspected, take your temperature and tell your caregiver of this when you call.  There is vaginal spotting, bleeding, or passing clots. Tell your caregiver of the amount and how many pads are used. Light spotting in pregnancy is common, especially following intercourse.  You develop a bad smelling vaginal discharge with a change in the color from clear to white.  You continue to feel   sick to your stomach (nauseated) and have no relief from remedies suggested. You vomit blood or coffee ground-like materials.  You lose more than 2 pounds of weight or gain more than 2 pounds of weight over 1 week, or as suggested by your caregiver.  You notice swelling of your face, hands, feet, or legs.  You get exposed to German measles and have never had them.  You are exposed to fifth disease or chickenpox.  You develop belly (abdominal) pain. Round ligament discomfort is a common non-cancerous (benign) cause of abdominal pain in pregnancy. Your caregiver still must evaluate you.  You develop a bad headache that does not go away.  You develop fever, diarrhea, pain with urination, or shortness of breath.  You develop visual problems, blurry, or double vision.  You fall or are in a car accident or any kind of trauma.  There is mental or physical violence at home. Document Released: 10/18/2001 Document Revised: 07/18/2012 Document Reviewed: 04/22/2009 [employer] Patient Information 2014 [employer].  

## 2013-07-22 ENCOUNTER — Ambulatory Visit (HOSPITAL_COMMUNITY): Admission: RE | Admit: 2013-07-22 | Payer: Medicaid Other | Source: Ambulatory Visit

## 2013-07-22 ENCOUNTER — Other Ambulatory Visit: Payer: Self-pay | Admitting: Family Medicine

## 2013-07-22 ENCOUNTER — Ambulatory Visit (HOSPITAL_COMMUNITY)
Admission: RE | Admit: 2013-07-22 | Discharge: 2013-07-22 | Disposition: A | Discharge status: Home / Self Care | Payer: Medicaid Other | Source: Ambulatory Visit | Attending: Family Medicine | Admitting: Family Medicine

## 2013-07-22 DIAGNOSIS — Z3689 Encounter for other specified antenatal screening: Secondary | ICD-10-CM | POA: Insufficient documentation

## 2013-07-22 DIAGNOSIS — Z3401 Encounter for supervision of normal first pregnancy, first trimester: Secondary | ICD-10-CM

## 2013-07-24 ENCOUNTER — Encounter: Payer: Self-pay | Admitting: Family Medicine

## 2013-07-24 ENCOUNTER — Encounter: Payer: Self-pay | Admitting: *Deleted

## 2013-08-07 ENCOUNTER — Ambulatory Visit (INDEPENDENT_AMBULATORY_CARE_PROVIDER_SITE_OTHER): Payer: Medicaid Other | Admitting: Family

## 2013-08-07 VITALS — BP 107/69 | Temp 97.4°F | Wt 130.1 lb

## 2013-08-07 DIAGNOSIS — Z3480 Encounter for supervision of other normal pregnancy, unspecified trimester: Secondary | ICD-10-CM

## 2013-08-07 DIAGNOSIS — Z3490 Encounter for supervision of normal pregnancy, unspecified, unspecified trimester: Secondary | ICD-10-CM

## 2013-08-07 DIAGNOSIS — Z23 Encounter for immunization: Secondary | ICD-10-CM

## 2013-08-07 LAB — POCT URINALYSIS DIP (DEVICE)
Bilirubin Urine: NEGATIVE
Glucose, UA: NEGATIVE mg/dL
Hgb urine dipstick: NEGATIVE
Ketones, ur: NEGATIVE mg/dL
Specific Gravity, Urine: 1.02 (ref 1.005–1.030)
Urobilinogen, UA: 0.2 mg/dL (ref 0.0–1.0)

## 2013-08-07 MED ORDER — INFLUENZA VIRUS VACC SPLIT PF IM SUSP
0.5000 mL | Freq: Once | INTRAMUSCULAR | Status: AC
  Administered 2013-08-07: 0.5 mL via INTRAMUSCULAR

## 2013-08-07 NOTE — Progress Notes (Signed)
No questions or concerns; reviewed blood work and ultrasound results and need for repeat in 3 weeks.  Flu vaccine today.

## 2013-08-07 NOTE — Progress Notes (Signed)
P = 77 

## 2013-08-12 ENCOUNTER — Encounter: Payer: Self-pay | Admitting: Family

## 2013-08-12 ENCOUNTER — Ambulatory Visit (HOSPITAL_COMMUNITY)
Admission: RE | Admit: 2013-08-12 | Discharge: 2013-08-12 | Disposition: A | Discharge status: Home / Self Care | Payer: Medicaid Other | Source: Ambulatory Visit | Attending: Family | Admitting: Family

## 2013-08-12 ENCOUNTER — Ambulatory Visit (HOSPITAL_COMMUNITY): Admission: RE | Admit: 2013-08-12 | Payer: Medicaid Other | Source: Ambulatory Visit

## 2013-08-12 DIAGNOSIS — Z3689 Encounter for other specified antenatal screening: Secondary | ICD-10-CM | POA: Insufficient documentation

## 2013-08-12 DIAGNOSIS — Z3490 Encounter for supervision of normal pregnancy, unspecified, unspecified trimester: Secondary | ICD-10-CM

## 2013-08-26 IMAGING — US US OB NUCHAL TRANSLUCENCY 1ST GEST
1 series · 13 of 28 positions shown · non-contrast
Comparison: none

OBSTETRICS REPORT
                      (Signed Final 05/29/2013 [DATE])

Service(s) Provided
 US FETAL NUCHAL TRANSLUCENCY                          76813.0
 MEASUREMENT
Indications
 First trimester aneuploidy screen (NT)
Fetal Evaluation
 Num Of Fetuses:    1
 Cardiac Activity:  Observed
 Placenta:          Anterior, above cervical os
 P. Cord            Visualized
 Insertion:
Gestational Age
 LMP:           12w 3d        Date:  03/03/13                 EDD:   12/08/13
 Best:          12w 3d     Det. By:  LMP  (03/03/13)          EDD:   12/08/13
1st Trimester Genetic Sonogram Screening
 CRL:            71.1  mm    G. Age:   13w 1d                 EDD:   12/03/13
 Nuc Trans:       1.9  mm
 Nasal Bone:                 Present
Cervix Uterus Adnexa
 Cervix:       Normal appearance by transabdominal scan. Appears
               closed, without funnelling.
 Left Ovary:    Within normal limits.
 Right Ovary:   Not visualized. No adnexal mass visualized.
Impression
INDICATION: 21 yr old G1P0 at 31w7d for first trimester screen.

[Series 1: us ob nuchal translucency 1st gest · 0.15mm/px · 13 of 33 slices shown]
[im 2/33]
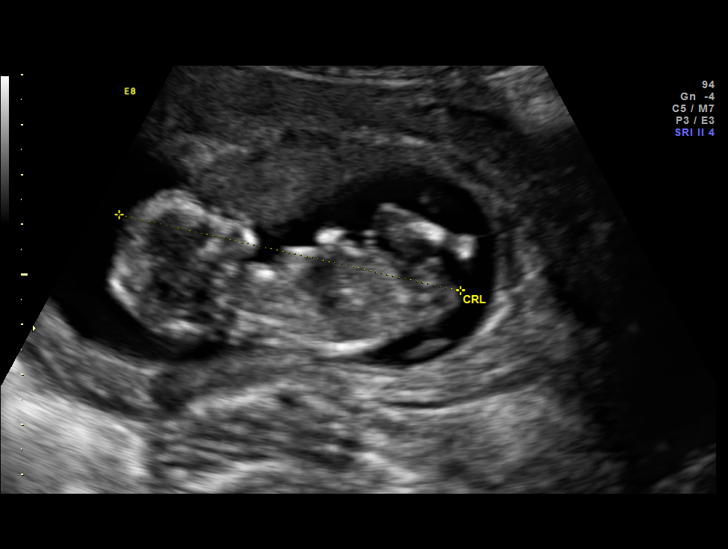
[im 4/33]
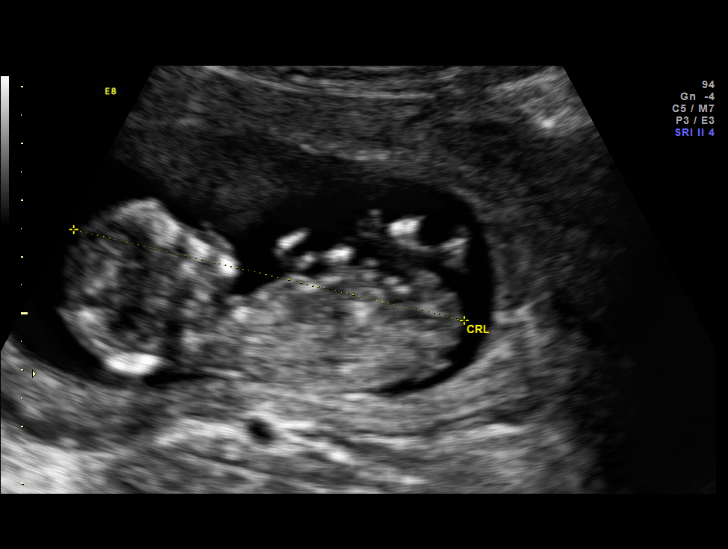
[im 6/33]
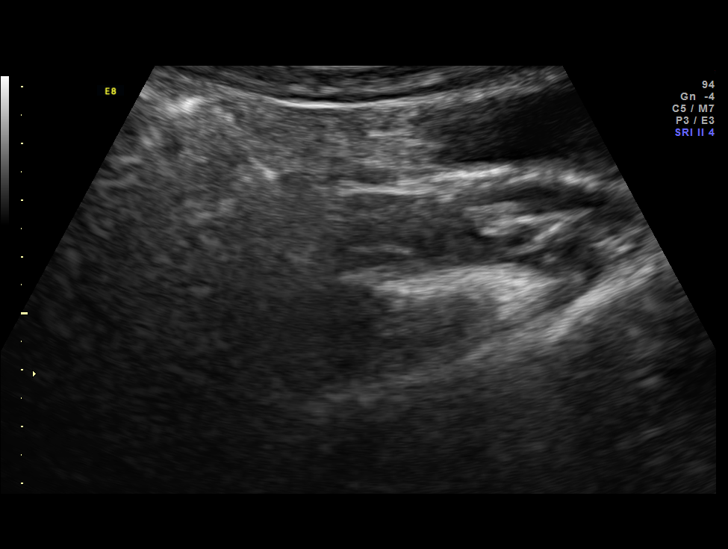
[im 9/33]
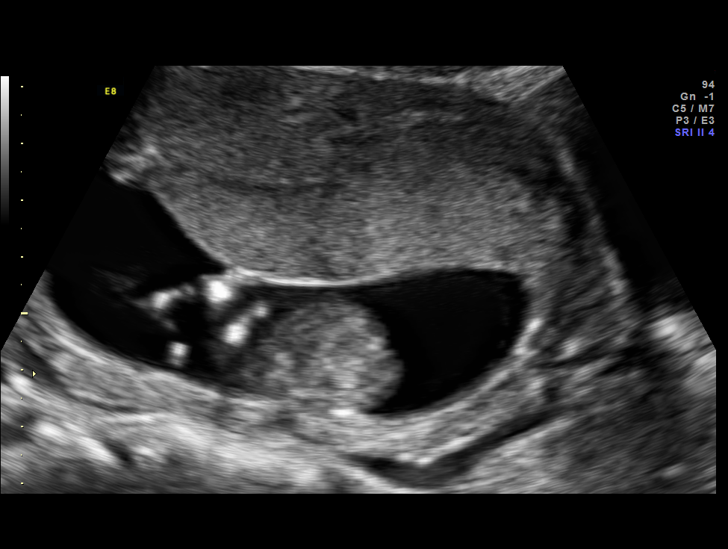
[im 11/33]
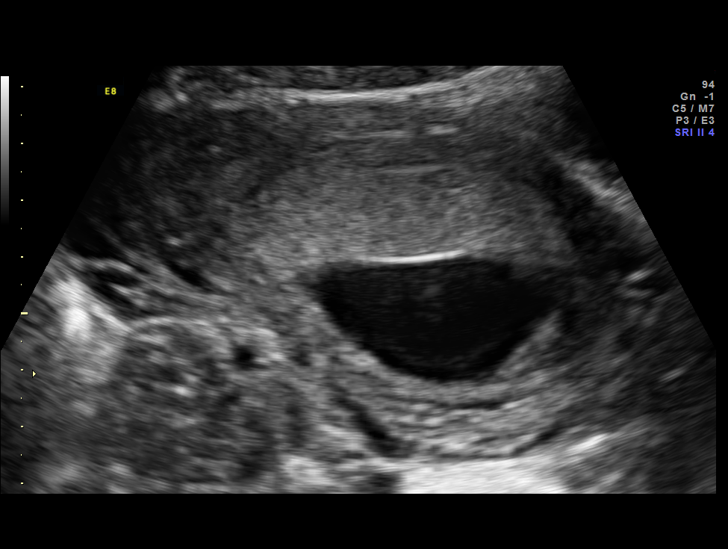
[im 14/33]
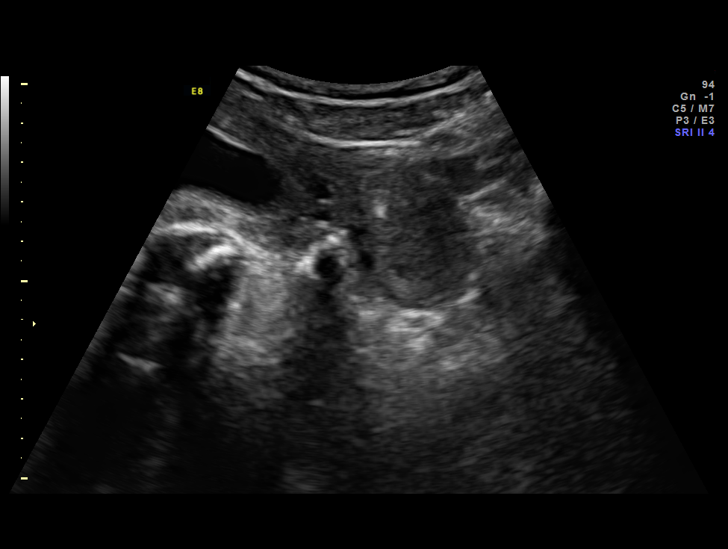
[im 17/33]
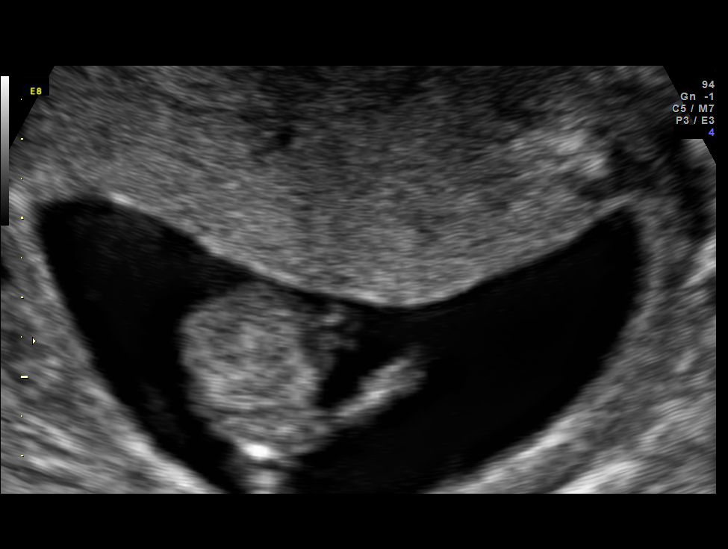
[im 19/33]
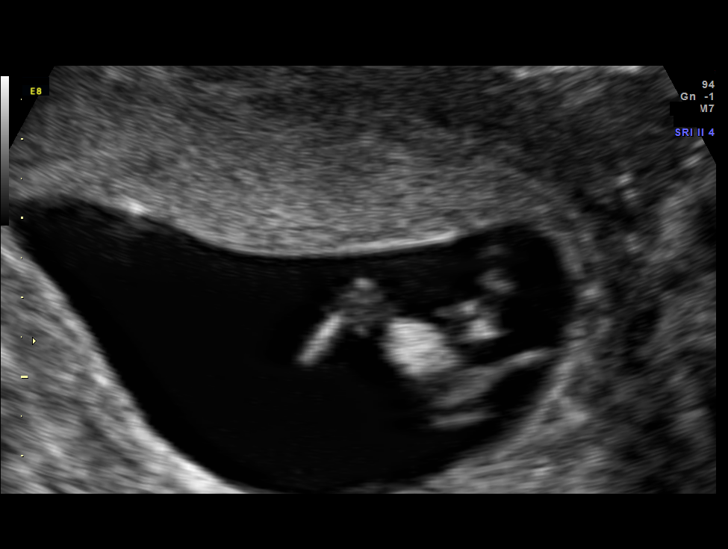
[im 22/33]
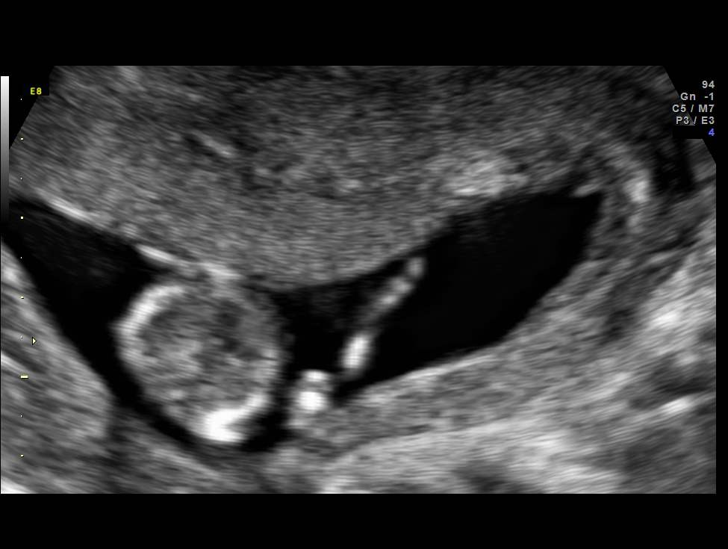
[im 24/33]
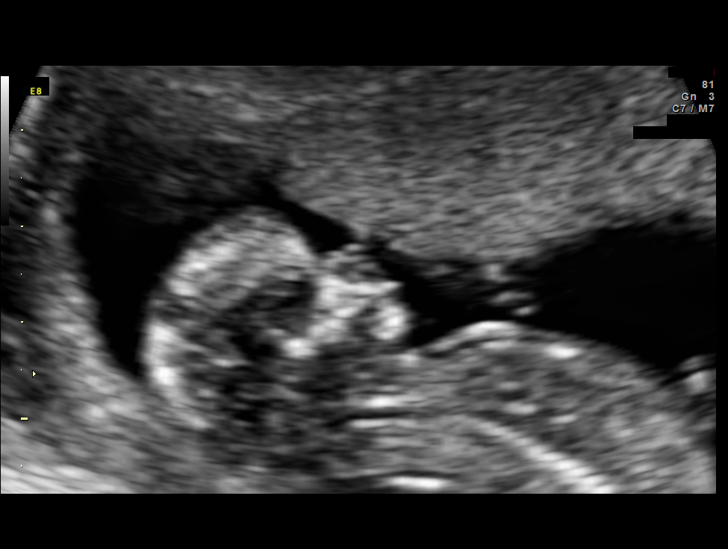
[im 27/33]
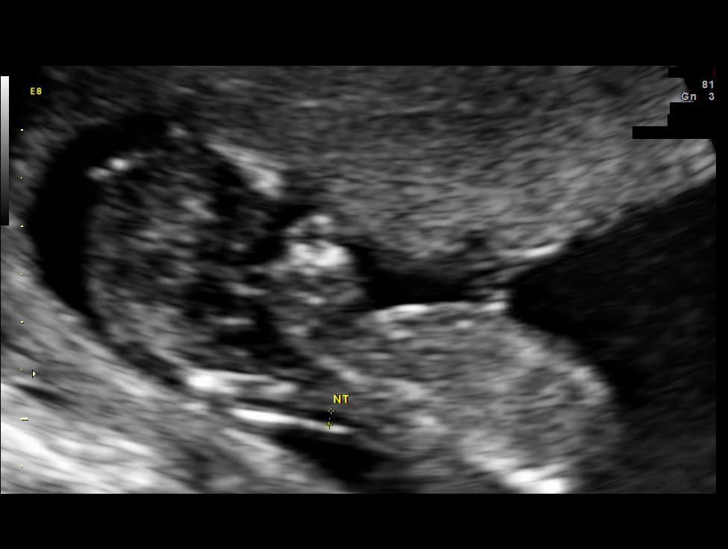
[im 29/33]
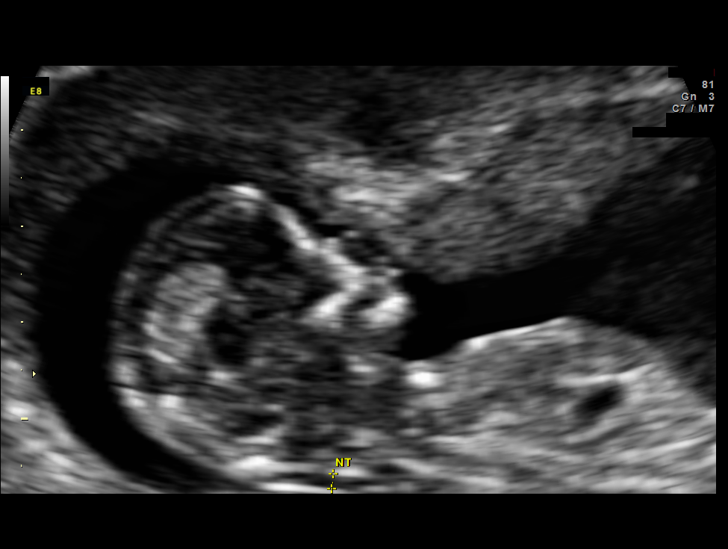
[im 31/33]
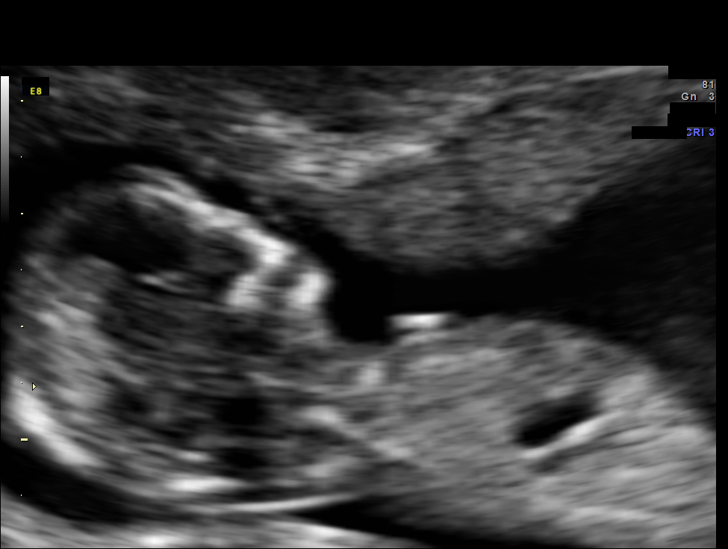

[13 of 28 positions shown; findings below may reference images not displayed]

FINDINGS: 1. Single intrauterine pregnancy.
 2. Fetal crown rump length is consistent with dating.
 3. Normal uterus; no adnexal masses seen.
 4. Evaluation of fetal anatomy is limited by early gestational
 age.
 5. Normal nuchal translucency measuring 1.9mm.
 6. The nasal bone is visualized.
Recommendations

 1. First trimester screen done today: discussed limitations in
 detecting fetal aneuploidy.
 2. Recommend maternal serum AFP at 15-20 weeks
 gestation.
 3. Recommend fetal anatomic survey at 18-20 weeks
 gestation.

## 2013-09-04 ENCOUNTER — Encounter: Payer: Self-pay | Admitting: *Deleted

## 2013-09-04 ENCOUNTER — Ambulatory Visit (INDEPENDENT_AMBULATORY_CARE_PROVIDER_SITE_OTHER): Payer: Medicaid Other | Admitting: Obstetrics and Gynecology

## 2013-09-04 VITALS — BP 121/71 | Temp 97.5°F | Wt 139.9 lb

## 2013-09-04 DIAGNOSIS — Z23 Encounter for immunization: Secondary | ICD-10-CM

## 2013-09-04 DIAGNOSIS — Z3400 Encounter for supervision of normal first pregnancy, unspecified trimester: Secondary | ICD-10-CM

## 2013-09-04 DIAGNOSIS — Z3490 Encounter for supervision of normal pregnancy, unspecified, unspecified trimester: Secondary | ICD-10-CM

## 2013-09-04 DIAGNOSIS — Z3401 Encounter for supervision of normal first pregnancy, first trimester: Secondary | ICD-10-CM

## 2013-09-04 LAB — CBC
HCT: 30.9 % — ABNORMAL LOW (ref 36.0–46.0)
Hemoglobin: 10.7 g/dL — ABNORMAL LOW (ref 12.0–15.0)
MCV: 93.6 fL (ref 78.0–100.0)
RBC: 3.3 MIL/uL — ABNORMAL LOW (ref 3.87–5.11)
RDW: 12.8 % (ref 11.5–15.5)
WBC: 12.5 10*3/uL — ABNORMAL HIGH (ref 4.0–10.5)

## 2013-09-04 LAB — POCT URINALYSIS DIP (DEVICE)
Bilirubin Urine: NEGATIVE
Glucose, UA: NEGATIVE mg/dL
Hgb urine dipstick: NEGATIVE
Specific Gravity, Urine: >=1.03 (ref 1.005–1.030)
Urobilinogen, UA: 0.2 mg/dL (ref 0.0–1.0)

## 2013-09-04 MED ORDER — TETANUS-DIPHTH-ACELL PERTUSSIS 5-2.5-18.5 LF-MCG/0.5 IM SUSP: 0.5000 mL | Freq: Once | INTRAMUSCULAR | Status: DC

## 2013-09-04 NOTE — Progress Notes (Signed)
Pulse: 85

## 2013-09-04 NOTE — Patient Instructions (Signed)
Pregnancy - Second Trimester The second trimester of pregnancy (3 to 6 months) is a period of rapid growth for you and your baby. At the end of the sixth month, your baby is about 9 inches long and weighs 1 1/2 pounds. You will begin to feel the baby move between 18 and 20 weeks of the pregnancy. This is called quickening. Weight gain is faster. A clear fluid (colostrum) may leak out of your breasts. You may feel small contractions of the womb (uterus). This is known as false labor or Braxton-Hicks contractions. This is like a practice for labor when the baby is ready to be born. Usually, the problems with morning sickness have usually passed by the end of your first trimester. Some women develop small dark blotches (called cholasma, mask of pregnancy) on their face that usually goes away after the baby is born. Exposure to the sun makes the blotches worse. Acne may also develop in some pregnant women and pregnant women who have acne, may find that it goes away. PRENATAL EXAMS  Blood work may continue to be done during prenatal exams. These tests are done to check on your health and the probable health of your baby. Blood work is used to follow your blood levels (hemoglobin). Anemia (low hemoglobin) is common during pregnancy. Iron and vitamins are given to help prevent this. You will also be checked for diabetes between 24 and 28 weeks of the pregnancy. Some of the previous blood tests may be repeated.  The size of the uterus is measured during each visit. This is to make sure that the baby is continuing to grow properly according to the dates of the pregnancy.  Your blood pressure is checked every prenatal visit. This is to make sure you are not getting toxemia.  Your urine is checked to make sure you do not have an infection, diabetes or protein in the urine.  Your weight is checked often to make sure gains are happening at the suggested rate. This is to ensure that both you and your baby are  growing normally.  Sometimes, an ultrasound is performed to confirm the proper growth and development of the baby. This is a test which bounces harmless sound waves off the baby so your caregiver can more accurately determine due dates. Sometimes, a test is done on the amniotic fluid surrounding the baby. This test is called an amniocentesis. The amniotic fluid is obtained by sticking a needle into the belly (abdomen). This is done to check the chromosomes in instances where there is a concern about possible genetic problems with the baby. It is also sometimes done near the end of pregnancy if an early delivery is required. In this case, it is done to help make sure the baby's lungs are mature enough for the baby to live outside of the womb. CHANGES OCCURING IN THE SECOND TRIMESTER OF PREGNANCY Your body goes through many changes during pregnancy. They vary from person to person. Talk to your caregiver about changes you notice that you are concerned about.  During the second trimester, you will likely have an increase in your appetite. It is normal to have cravings for certain foods. This varies from person to person and pregnancy to pregnancy.  Your lower abdomen will begin to bulge.  You may have to urinate more often because the uterus and baby are pressing on your bladder. It is also common to get more bladder infections during pregnancy. You can help this by drinking lots of fluids   and emptying your bladder before and after intercourse.  You may begin to get stretch marks on your hips, abdomen, and breasts. These are normal changes in the body during pregnancy. There are no exercises or medicines to take that prevent this change.  You may begin to develop swollen and bulging veins (varicose veins) in your legs. Wearing support hose, elevating your feet for 15 minutes, 3 to 4 times a day and limiting salt in your diet helps lessen the problem.  Heartburn may develop as the uterus grows and  pushes up against the stomach. Antacids recommended by your caregiver helps with this problem. Also, eating smaller meals 4 to 5 times a day helps.  Constipation can be treated with a stool softener or adding bulk to your diet. Drinking lots of fluids, and eating vegetables, fruits, and whole grains are helpful.  Exercising is also helpful. If you have been very active up until your pregnancy, most of these activities can be continued during your pregnancy. If you have been less active, it is helpful to start an exercise program such as walking.  Hemorrhoids may develop at the end of the second trimester. Warm sitz baths and hemorrhoid cream recommended by your caregiver helps hemorrhoid problems.  Backaches may develop during this time of your pregnancy. Avoid heavy lifting, wear low heal shoes, and practice good posture to help with backache problems.  Some pregnant women develop tingling and numbness of their hand and fingers because of swelling and tightening of ligaments in the wrist (carpel tunnel syndrome). This goes away after the baby is born.  As your breasts enlarge, you may have to get a bigger bra. Get a comfortable, cotton, support bra. Do not get a nursing bra until the last month of the pregnancy if you will be nursing the baby.  You may get a dark line from your belly button to the pubic area called the linea nigra.  You may develop rosy cheeks because of increase blood flow to the face.  You may develop spider looking lines of the face, neck, arms, and chest. These go away after the baby is born. HOME CARE INSTRUCTIONS   It is extremely important to avoid all smoking, herbs, alcohol, and unprescribed drugs during your pregnancy. These chemicals affect the formation and growth of the baby. Avoid these chemicals throughout the pregnancy to ensure the delivery of a healthy infant.  Most of your home care instructions are the same as suggested for the first trimester of your  pregnancy. Keep your caregiver's appointments. Follow your caregiver's instructions regarding medicine use, exercise, and diet.  During pregnancy, you are providing food for you and your baby. Continue to eat regular, well-balanced meals. Choose foods such as meat, fish, milk and other low fat dairy products, vegetables, fruits, and whole-grain breads and cereals. Your caregiver will tell you of the ideal weight gain.  A physical sexual relationship may be continued up until near the end of pregnancy if there are no other problems. Problems could include early (premature) leaking of amniotic fluid from the membranes, vaginal bleeding, abdominal pain, or other medical or pregnancy problems.  Exercise regularly if there are no restrictions. Check with your caregiver if you are unsure of the safety of some of your exercises. The greatest weight gain will occur in the last 2 trimesters of pregnancy. Exercise will help you:  Control your weight.  Get you in shape for labor and delivery.  Lose weight after you have the baby.  Wear   a good support or jogging bra for breast tenderness during pregnancy. This may help if worn during sleep. Pads or tissues may be used in the bra if you are leaking colostrum.  Do not use hot tubs, steam rooms or saunas throughout the pregnancy.  Wear your seat belt at all times when driving. This protects you and your baby if you are in an accident.  Avoid raw meat, uncooked cheese, cat litter boxes, and soil used by cats. These carry germs that can cause birth defects in the baby.  The second trimester is also a good time to visit your dentist for your dental health if this has not been done yet. Getting your teeth cleaned is okay. Use a soft toothbrush. Brush gently during pregnancy.  It is easier to leak urine during pregnancy. Tightening up and strengthening the pelvic muscles will help with this problem. Practice stopping your urination while you are going to the  bathroom. These are the same muscles you need to strengthen. It is also the muscles you would use as if you were trying to stop from passing gas. You can practice tightening these muscles up 10 times a set and repeating this about 3 times per day. Once you know what muscles to tighten up, do not perform these exercises during urination. It is more likely to contribute to an infection by backing up the urine.  Ask for help if you have financial, counseling, or nutritional needs during pregnancy. Your caregiver will be able to offer counseling for these needs as well as refer you for other special needs.  Your skin may become oily. If so, wash your face with mild soap, use non-greasy moisturizer and oil or cream based makeup. MEDICINES AND DRUG USE IN PREGNANCY  Take prenatal vitamins as directed. The vitamin should contain 1 milligram of folic acid. Keep all vitamins out of reach of children. Only a couple vitamins or tablets containing iron may be fatal to a baby or young child when ingested.  Avoid use of all medicines, including herbs, over-the-counter medicines, not prescribed or suggested by your caregiver. Only take over-the-counter or prescription medicines for pain, discomfort, or fever as directed by your caregiver. Do not use aspirin.  Let your caregiver also know about herbs you may be using.  Alcohol is related to a number of birth defects. This includes fetal alcohol syndrome. All alcohol, in any form, should be avoided completely. Smoking will cause low birth rate and premature babies.  Street or illegal drugs are very harmful to the baby. They are absolutely forbidden. A baby born to an addicted mother will be addicted at birth. The baby will go through the same withdrawal an adult does. SEEK MEDICAL CARE IF:  You have any concerns or worries during your pregnancy. It is better to call with your questions if you feel they cannot wait, rather than worry about them. SEEK IMMEDIATE  MEDICAL CARE IF:   An unexplained oral temperature above 102 F (38.9 C) develops, or as your caregiver suggests.  You have leaking of fluid from the vagina (birth canal). If leaking membranes are suspected, take your temperature and tell your caregiver of this when you call.  There is vaginal spotting, bleeding, or passing clots. Tell your caregiver of the amount and how many pads are used. Light spotting in pregnancy is common, especially following intercourse.  You develop a bad smelling vaginal discharge with a change in the color from clear to white.  You continue to feel   sick to your stomach (nauseated) and have no relief from remedies suggested. You vomit blood or coffee ground-like materials.  You lose more than 2 pounds of weight or gain more than 2 pounds of weight over 1 week, or as suggested by your caregiver.  You notice swelling of your face, hands, feet, or legs.  You get exposed to German measles and have never had them.  You are exposed to fifth disease or chickenpox.  You develop belly (abdominal) pain. Round ligament discomfort is a common non-cancerous (benign) cause of abdominal pain in pregnancy. Your caregiver still must evaluate you.  You develop a bad headache that does not go away.  You develop fever, diarrhea, pain with urination, or shortness of breath.  You develop visual problems, blurry, or double vision.  You fall or are in a car accident or any kind of trauma.  There is mental or physical violence at home. Document Released: 10/18/2001 Document Revised: 07/18/2012 Document Reviewed: 04/22/2009 [employer] Patient Information 2014 [employer].  

## 2013-09-04 NOTE — Progress Notes (Signed)
Tdap, glucola today. Partner supportive. Doing well.

## 2013-09-05 LAB — HIV ANTIBODY (ROUTINE TESTING W REFLEX): HIV: NONREACTIVE

## 2013-09-05 LAB — GLUCOSE TOLERANCE, 1 HOUR (50G) W/O FASTING: Glucose, 1 Hour GTT: 102 mg/dL (ref 70–140)

## 2013-09-18 ENCOUNTER — Ambulatory Visit (INDEPENDENT_AMBULATORY_CARE_PROVIDER_SITE_OTHER): Payer: Medicaid Other | Admitting: Obstetrics and Gynecology

## 2013-09-18 ENCOUNTER — Encounter: Payer: Self-pay | Admitting: Obstetrics and Gynecology

## 2013-09-18 VITALS — BP 126/76 | Temp 98.1°F | Wt 143.0 lb

## 2013-09-18 DIAGNOSIS — Z3403 Encounter for supervision of normal first pregnancy, third trimester: Secondary | ICD-10-CM

## 2013-09-18 DIAGNOSIS — Z3400 Encounter for supervision of normal first pregnancy, unspecified trimester: Secondary | ICD-10-CM

## 2013-09-18 LAB — POCT URINALYSIS DIP (DEVICE)
Ketones, ur: NEGATIVE mg/dL
Protein, ur: NEGATIVE mg/dL
Specific Gravity, Urine: 1.02 (ref 1.005–1.030)
Urobilinogen, UA: 0.2 mg/dL (ref 0.0–1.0)
pH: 7 (ref 5.0–8.0)

## 2013-09-18 NOTE — Progress Notes (Signed)
1 hr glucola and TDaP today. Doing well. Good FM.

## 2013-09-18 NOTE — Patient Instructions (Signed)
Third Trimester of Pregnancy  The third trimester is from week 29 through week 42, months 7 through 9. The third trimester is a time when the fetus is growing rapidly. At the end of the ninth month, the fetus is about 20 inches in length and weighs 6 10 pounds.   BODY CHANGES  Your body goes through many changes during pregnancy. The changes vary from woman to woman.    Your weight will continue to increase. You can expect to gain 25 35 pounds (11 16 kg) by the end of the pregnancy.   You may begin to get stretch marks on your hips, abdomen, and breasts.   You may urinate more often because the fetus is moving lower into your pelvis and pressing on your bladder.   You may develop or continue to have heartburn as a result of your pregnancy.   You may develop constipation because certain hormones are causing the muscles that push waste through your intestines to slow down.   You may develop hemorrhoids or swollen, bulging veins (varicose veins).   You may have pelvic pain because of the weight gain and pregnancy hormones relaxing your joints between the bones in your pelvis. Back aches may result from over exertion of the muscles supporting your posture.   Your breasts will continue to grow and be tender. A yellow discharge may leak from your breasts called colostrum.   Your belly button may stick out.   You may feel short of breath because of your expanding uterus.   You may notice the fetus "dropping," or moving lower in your abdomen.   You may have a bloody mucus discharge. This usually occurs a few days to a week before labor begins.   Your cervix becomes thin and soft (effaced) near your due date.  WHAT TO EXPECT AT YOUR PRENATAL EXAMS   You will have prenatal exams every 2 weeks until week 36. Then, you will have weekly prenatal exams. During a routine prenatal visit:   You will be weighed to make sure you and the fetus are growing normally.   Your blood pressure is taken.   Your abdomen will be  measured to track your baby's growth.   The fetal heartbeat will be listened to.   Any test results from the previous visit will be discussed.   You may have a cervical check near your due date to see if you have effaced.  At around 36 weeks, your caregiver will check your cervix. At the same time, your caregiver will also perform a test on the secretions of the vaginal tissue. This test is to determine if a type of bacteria, Group B streptococcus, is present. Your caregiver will explain this further.  Your caregiver may ask you:   What your birth plan is.   How you are feeling.   If you are feeling the baby move.   If you have had any abnormal symptoms, such as leaking fluid, bleeding, severe headaches, or abdominal cramping.   If you have any questions.  Other tests or screenings that may be performed during your third trimester include:   Blood tests that check for low iron levels (anemia).   Fetal testing to check the health, activity level, and growth of the fetus. Testing is done if you have certain medical conditions or if there are problems during the pregnancy.  FALSE LABOR  You may feel small, irregular contractions that eventually go away. These are called Braxton Hicks contractions, or   false labor. Contractions may last for hours, days, or even weeks before true labor sets in. If contractions come at regular intervals, intensify, or become painful, it is best to be seen by your caregiver.   SIGNS OF LABOR    Menstrual-like cramps.   Contractions that are 5 minutes apart or less.   Contractions that start on the top of the uterus and spread down to the lower abdomen and back.   A sense of increased pelvic pressure or back pain.   A watery or bloody mucus discharge that comes from the vagina.  If you have any of these signs before the 37th week of pregnancy, call your caregiver right away. You need to go to the hospital to get checked immediately.  HOME CARE INSTRUCTIONS    Avoid all  smoking, herbs, alcohol, and unprescribed drugs. These chemicals affect the formation and growth of the baby.   Follow your caregiver's instructions regarding medicine use. There are medicines that are either safe or unsafe to take during pregnancy.   Exercise only as directed by your caregiver. Experiencing uterine cramps is a good sign to stop exercising.   Continue to eat regular, healthy meals.   Wear a good support bra for breast tenderness.   Do not use hot tubs, steam rooms, or saunas.   Wear your seat belt at all times when driving.   Avoid raw meat, uncooked cheese, cat litter boxes, and soil used by cats. These carry germs that can cause birth defects in the baby.   Take your prenatal vitamins.   Try taking a stool softener (if your caregiver approves) if you develop constipation. Eat more high-fiber foods, such as fresh vegetables or fruit and whole grains. Drink plenty of fluids to keep your urine clear or pale yellow.   Take warm sitz baths to soothe any pain or discomfort caused by hemorrhoids. Use hemorrhoid cream if your caregiver approves.   If you develop varicose veins, wear support hose. Elevate your feet for 15 minutes, 3 4 times a day. Limit salt in your diet.   Avoid heavy lifting, wear low heal shoes, and practice good posture.   Rest a lot with your legs elevated if you have leg cramps or low back pain.   Visit your dentist if you have not gone during your pregnancy. Use a soft toothbrush to brush your teeth and be gentle when you floss.   A sexual relationship may be continued unless your caregiver directs you otherwise.   Do not travel far distances unless it is absolutely necessary and only with the approval of your caregiver.   Take prenatal classes to understand, practice, and ask questions about the labor and delivery.   Make a trial run to the hospital.   Pack your hospital bag.   Prepare the baby's nursery.   Continue to go to all your prenatal visits as directed  by your caregiver.  SEEK MEDICAL CARE IF:   You are unsure if you are in labor or if your water has broken.   You have dizziness.   You have mild pelvic cramps, pelvic pressure, or nagging pain in your abdominal area.   You have persistent nausea, vomiting, or diarrhea.   You have a bad smelling vaginal discharge.   You have pain with urination.  SEEK IMMEDIATE MEDICAL CARE IF:    You have a fever.   You are leaking fluid from your vagina.   You have spotting or bleeding from your vagina.     You have severe abdominal cramping or pain.   You have rapid weight loss or gain.   You have shortness of breath with chest pain.   You notice sudden or extreme swelling of your face, hands, ankles, feet, or legs.   You have not felt your baby move in over an hour.   You have severe headaches that do not go away with medicine.   You have vision changes.  Document Released: 10/18/2001 Document Revised: 06/26/2013 Document Reviewed: 12/25/2012  [employer] Patient Information 2014 [employer].

## 2013-09-18 NOTE — Progress Notes (Signed)
Doing well. Ready for baby. Encouraged classes.

## 2013-09-18 NOTE — Progress Notes (Signed)
p-100 

## 2013-10-02 ENCOUNTER — Ambulatory Visit (INDEPENDENT_AMBULATORY_CARE_PROVIDER_SITE_OTHER): Payer: Medicaid Other | Admitting: Advanced Practice Midwife

## 2013-10-02 ENCOUNTER — Encounter: Payer: Self-pay | Admitting: Advanced Practice Midwife

## 2013-10-02 VITALS — BP 110/72 | Wt 148.7 lb

## 2013-10-02 DIAGNOSIS — Z3400 Encounter for supervision of normal first pregnancy, unspecified trimester: Secondary | ICD-10-CM

## 2013-10-02 DIAGNOSIS — Z3401 Encounter for supervision of normal first pregnancy, first trimester: Secondary | ICD-10-CM

## 2013-10-02 DIAGNOSIS — Z3403 Encounter for supervision of normal first pregnancy, third trimester: Secondary | ICD-10-CM

## 2013-10-02 NOTE — Patient Instructions (Signed)
Third Trimester of Pregnancy  The third trimester is from week 29 through week 42, months 7 through 9. The third trimester is a time when the fetus is growing rapidly. At the end of the ninth month, the fetus is about 20 inches in length and weighs 6 10 pounds.   BODY CHANGES  Your body goes through many changes during pregnancy. The changes vary from woman to woman.    Your weight will continue to increase. You can expect to gain 25 35 pounds (11 16 kg) by the end of the pregnancy.   You may begin to get stretch marks on your hips, abdomen, and breasts.   You may urinate more often because the fetus is moving lower into your pelvis and pressing on your bladder.   You may develop or continue to have heartburn as a result of your pregnancy.   You may develop constipation because certain hormones are causing the muscles that push waste through your intestines to slow down.   You may develop hemorrhoids or swollen, bulging veins (varicose veins).   You may have pelvic pain because of the weight gain and pregnancy hormones relaxing your joints between the bones in your pelvis. Back aches may result from over exertion of the muscles supporting your posture.   Your breasts will continue to grow and be tender. A yellow discharge may leak from your breasts called colostrum.   Your belly button may stick out.   You may feel short of breath because of your expanding uterus.   You may notice the fetus "dropping," or moving lower in your abdomen.   You may have a bloody mucus discharge. This usually occurs a few days to a week before labor begins.   Your cervix becomes thin and soft (effaced) near your due date.  WHAT TO EXPECT AT YOUR PRENATAL EXAMS   You will have prenatal exams every 2 weeks until week 36. Then, you will have weekly prenatal exams. During a routine prenatal visit:   You will be weighed to make sure you and the fetus are growing normally.   Your blood pressure is taken.   Your abdomen will be  measured to track your baby's growth.   The fetal heartbeat will be listened to.   Any test results from the previous visit will be discussed.   You may have a cervical check near your due date to see if you have effaced.  At around 36 weeks, your caregiver will check your cervix. At the same time, your caregiver will also perform a test on the secretions of the vaginal tissue. This test is to determine if a type of bacteria, Group B streptococcus, is present. Your caregiver will explain this further.  Your caregiver may ask you:   What your birth plan is.   How you are feeling.   If you are feeling the baby move.   If you have had any abnormal symptoms, such as leaking fluid, bleeding, severe headaches, or abdominal cramping.   If you have any questions.  Other tests or screenings that may be performed during your third trimester include:   Blood tests that check for low iron levels (anemia).   Fetal testing to check the health, activity level, and growth of the fetus. Testing is done if you have certain medical conditions or if there are problems during the pregnancy.  FALSE LABOR  You may feel small, irregular contractions that eventually go away. These are called Braxton Hicks contractions, or   false labor. Contractions may last for hours, days, or even weeks before true labor sets in. If contractions come at regular intervals, intensify, or become painful, it is best to be seen by your caregiver.   SIGNS OF LABOR    Menstrual-like cramps.   Contractions that are 5 minutes apart or less.   Contractions that start on the top of the uterus and spread down to the lower abdomen and back.   A sense of increased pelvic pressure or back pain.   A watery or bloody mucus discharge that comes from the vagina.  If you have any of these signs before the 37th week of pregnancy, call your caregiver right away. You need to go to the hospital to get checked immediately.  HOME CARE INSTRUCTIONS    Avoid all  smoking, herbs, alcohol, and unprescribed drugs. These chemicals affect the formation and growth of the baby.   Follow your caregiver's instructions regarding medicine use. There are medicines that are either safe or unsafe to take during pregnancy.   Exercise only as directed by your caregiver. Experiencing uterine cramps is a good sign to stop exercising.   Continue to eat regular, healthy meals.   Wear a good support bra for breast tenderness.   Do not use hot tubs, steam rooms, or saunas.   Wear your seat belt at all times when driving.   Avoid raw meat, uncooked cheese, cat litter boxes, and soil used by cats. These carry germs that can cause birth defects in the baby.   Take your prenatal vitamins.   Try taking a stool softener (if your caregiver approves) if you develop constipation. Eat more high-fiber foods, such as fresh vegetables or fruit and whole grains. Drink plenty of fluids to keep your urine clear or pale yellow.   Take warm sitz baths to soothe any pain or discomfort caused by hemorrhoids. Use hemorrhoid cream if your caregiver approves.   If you develop varicose veins, wear support hose. Elevate your feet for 15 minutes, 3 4 times a day. Limit salt in your diet.   Avoid heavy lifting, wear low heal shoes, and practice good posture.   Rest a lot with your legs elevated if you have leg cramps or low back pain.   Visit your dentist if you have not gone during your pregnancy. Use a soft toothbrush to brush your teeth and be gentle when you floss.   A sexual relationship may be continued unless your caregiver directs you otherwise.   Do not travel far distances unless it is absolutely necessary and only with the approval of your caregiver.   Take prenatal classes to understand, practice, and ask questions about the labor and delivery.   Make a trial run to the hospital.   Pack your hospital bag.   Prepare the baby's nursery.   Continue to go to all your prenatal visits as directed  by your caregiver.  SEEK MEDICAL CARE IF:   You are unsure if you are in labor or if your water has broken.   You have dizziness.   You have mild pelvic cramps, pelvic pressure, or nagging pain in your abdominal area.   You have persistent nausea, vomiting, or diarrhea.   You have a bad smelling vaginal discharge.   You have pain with urination.  SEEK IMMEDIATE MEDICAL CARE IF:    You have a fever.   You are leaking fluid from your vagina.   You have spotting or bleeding from your vagina.     You have severe abdominal cramping or pain.   You have rapid weight loss or gain.   You have shortness of breath with chest pain.   You notice sudden or extreme swelling of your face, hands, ankles, feet, or legs.   You have not felt your baby move in over an hour.   You have severe headaches that do not go away with medicine.   You have vision changes.  Document Released: 10/18/2001 Document Revised: 06/26/2013 Document Reviewed: 12/25/2012  [employer] Patient Information 2014 [employer].

## 2013-10-02 NOTE — Progress Notes (Signed)
Pulse: 84

## 2013-10-02 NOTE — Progress Notes (Signed)
Doing well. Informed glucola normal. Discussed peds, breastfeeding, etc

## 2013-10-18 ENCOUNTER — Encounter: Payer: Self-pay | Admitting: Obstetrics & Gynecology

## 2013-10-18 ENCOUNTER — Ambulatory Visit (INDEPENDENT_AMBULATORY_CARE_PROVIDER_SITE_OTHER): Payer: Medicaid Other | Admitting: Obstetrics & Gynecology

## 2013-10-18 VITALS — BP 120/73 | Wt 152.4 lb

## 2013-10-18 DIAGNOSIS — Z3401 Encounter for supervision of normal first pregnancy, first trimester: Secondary | ICD-10-CM

## 2013-10-18 DIAGNOSIS — Z3400 Encounter for supervision of normal first pregnancy, unspecified trimester: Secondary | ICD-10-CM

## 2013-10-18 LAB — POCT URINALYSIS DIP (DEVICE)
Hgb urine dipstick: NEGATIVE
Nitrite: NEGATIVE
Protein, ur: NEGATIVE mg/dL
Specific Gravity, Urine: 1.02 (ref 1.005–1.030)
Urobilinogen, UA: 0.2 mg/dL (ref 0.0–1.0)

## 2013-10-18 NOTE — Patient Instructions (Signed)
Third Trimester of Pregnancy  The third trimester is from week 29 through week 42, months 7 through 9. The third trimester is a time when the fetus is growing rapidly. At the end of the ninth month, the fetus is about 20 inches in length and weighs 6 10 pounds.   BODY CHANGES  Your body goes through many changes during pregnancy. The changes vary from woman to woman.    Your weight will continue to increase. You can expect to gain 25 35 pounds (11 16 kg) by the end of the pregnancy.   You may begin to get stretch marks on your hips, abdomen, and breasts.   You may urinate more often because the fetus is moving lower into your pelvis and pressing on your bladder.   You may develop or continue to have heartburn as a result of your pregnancy.   You may develop constipation because certain hormones are causing the muscles that push waste through your intestines to slow down.   You may develop hemorrhoids or swollen, bulging veins (varicose veins).   You may have pelvic pain because of the weight gain and pregnancy hormones relaxing your joints between the bones in your pelvis. Back aches may result from over exertion of the muscles supporting your posture.   Your breasts will continue to grow and be tender. A yellow discharge may leak from your breasts called colostrum.   Your belly button may stick out.   You may feel short of breath because of your expanding uterus.   You may notice the fetus "dropping," or moving lower in your abdomen.   You may have a bloody mucus discharge. This usually occurs a few days to a week before labor begins.   Your cervix becomes thin and soft (effaced) near your due date.  WHAT TO EXPECT AT YOUR PRENATAL EXAMS   You will have prenatal exams every 2 weeks until week 36. Then, you will have weekly prenatal exams. During a routine prenatal visit:   You will be weighed to make sure you and the fetus are growing normally.   Your blood pressure is taken.   Your abdomen will be  measured to track your baby's growth.   The fetal heartbeat will be listened to.   Any test results from the previous visit will be discussed.   You may have a cervical check near your due date to see if you have effaced.  At around 36 weeks, your caregiver will check your cervix. At the same time, your caregiver will also perform a test on the secretions of the vaginal tissue. This test is to determine if a type of bacteria, Group B streptococcus, is present. Your caregiver will explain this further.  Your caregiver may ask you:   What your birth plan is.   How you are feeling.   If you are feeling the baby move.   If you have had any abnormal symptoms, such as leaking fluid, bleeding, severe headaches, or abdominal cramping.   If you have any questions.  Other tests or screenings that may be performed during your third trimester include:   Blood tests that check for low iron levels (anemia).   Fetal testing to check the health, activity level, and growth of the fetus. Testing is done if you have certain medical conditions or if there are problems during the pregnancy.  FALSE LABOR  You may feel small, irregular contractions that eventually go away. These are called Braxton Hicks contractions, or   false labor. Contractions may last for hours, days, or even weeks before true labor sets in. If contractions come at regular intervals, intensify, or become painful, it is best to be seen by your caregiver.   SIGNS OF LABOR    Menstrual-like cramps.   Contractions that are 5 minutes apart or less.   Contractions that start on the top of the uterus and spread down to the lower abdomen and back.   A sense of increased pelvic pressure or back pain.   A watery or bloody mucus discharge that comes from the vagina.  If you have any of these signs before the 37th week of pregnancy, call your caregiver right away. You need to go to the hospital to get checked immediately.  HOME CARE INSTRUCTIONS    Avoid all  smoking, herbs, alcohol, and unprescribed drugs. These chemicals affect the formation and growth of the baby.   Follow your caregiver's instructions regarding medicine use. There are medicines that are either safe or unsafe to take during pregnancy.   Exercise only as directed by your caregiver. Experiencing uterine cramps is a good sign to stop exercising.   Continue to eat regular, healthy meals.   Wear a good support bra for breast tenderness.   Do not use hot tubs, steam rooms, or saunas.   Wear your seat belt at all times when driving.   Avoid raw meat, uncooked cheese, cat litter boxes, and soil used by cats. These carry germs that can cause birth defects in the baby.   Take your prenatal vitamins.   Try taking a stool softener (if your caregiver approves) if you develop constipation. Eat more high-fiber foods, such as fresh vegetables or fruit and whole grains. Drink plenty of fluids to keep your urine clear or pale yellow.   Take warm sitz baths to soothe any pain or discomfort caused by hemorrhoids. Use hemorrhoid cream if your caregiver approves.   If you develop varicose veins, wear support hose. Elevate your feet for 15 minutes, 3 4 times a day. Limit salt in your diet.   Avoid heavy lifting, wear low heal shoes, and practice good posture.   Rest a lot with your legs elevated if you have leg cramps or low back pain.   Visit your dentist if you have not gone during your pregnancy. Use a soft toothbrush to brush your teeth and be gentle when you floss.   A sexual relationship may be continued unless your caregiver directs you otherwise.   Do not travel far distances unless it is absolutely necessary and only with the approval of your caregiver.   Take prenatal classes to understand, practice, and ask questions about the labor and delivery.   Make a trial run to the hospital.   Pack your hospital bag.   Prepare the baby's nursery.   Continue to go to all your prenatal visits as directed  by your caregiver.  SEEK MEDICAL CARE IF:   You are unsure if you are in labor or if your water has broken.   You have dizziness.   You have mild pelvic cramps, pelvic pressure, or nagging pain in your abdominal area.   You have persistent nausea, vomiting, or diarrhea.   You have a bad smelling vaginal discharge.   You have pain with urination.  SEEK IMMEDIATE MEDICAL CARE IF:    You have a fever.   You are leaking fluid from your vagina.   You have spotting or bleeding from your vagina.     You have severe abdominal cramping or pain.   You have rapid weight loss or gain.   You have shortness of breath with chest pain.   You notice sudden or extreme swelling of your face, hands, ankles, feet, or legs.   You have not felt your baby move in over an hour.   You have severe headaches that do not go away with medicine.   You have vision changes.  Document Released: 10/18/2001 Document Revised: 06/26/2013 Document Reviewed: 12/25/2012  [employer] Patient Information 2014 [employer].

## 2013-10-18 NOTE — Progress Notes (Signed)
Pt with no complaints.  Plans to breastfeed. rec breastfeeding and childbirth classes F/u in 2weeks or sooner prn

## 2013-10-18 NOTE — Progress Notes (Signed)
Pulse- 94 

## 2013-10-28 ENCOUNTER — Encounter: Payer: Self-pay | Admitting: Obstetrics and Gynecology

## 2013-10-28 ENCOUNTER — Ambulatory Visit (INDEPENDENT_AMBULATORY_CARE_PROVIDER_SITE_OTHER): Payer: Medicaid Other | Admitting: Obstetrics and Gynecology

## 2013-10-28 VITALS — BP 108/74 | Temp 97.4°F | Wt 154.6 lb

## 2013-10-28 DIAGNOSIS — Z3401 Encounter for supervision of normal first pregnancy, first trimester: Secondary | ICD-10-CM

## 2013-10-28 DIAGNOSIS — IMO0002 Reserved for concepts with insufficient information to code with codable children: Secondary | ICD-10-CM

## 2013-10-28 DIAGNOSIS — Z3400 Encounter for supervision of normal first pregnancy, unspecified trimester: Secondary | ICD-10-CM

## 2013-10-28 DIAGNOSIS — R6889 Other general symptoms and signs: Secondary | ICD-10-CM

## 2013-10-28 LAB — POCT URINALYSIS DIP (DEVICE)
Nitrite: NEGATIVE
Urobilinogen, UA: 0.2 mg/dL (ref 0.0–1.0)
pH: 7 (ref 5.0–8.0)

## 2013-10-28 NOTE — Progress Notes (Signed)
Patient doing well without complaints. FM/PTL precautions reviewed. Patient remains undecided on contraception. Reviewed different methods with patient. Patient desires circ via FOB insurance

## 2013-10-28 NOTE — Progress Notes (Signed)
P=88 

## 2013-10-29 ENCOUNTER — Encounter: Payer: Self-pay | Admitting: Obstetrics and Gynecology

## 2013-11-05 ENCOUNTER — Encounter: Payer: Self-pay | Admitting: *Deleted

## 2013-11-07 NOTE — L&D Delivery Note (Signed)
Operative Delivery Note At 4:26 AM a viable female was delivered via Vaginal, Spontaneous Delivery.  Presentation: vertex; Position: Left,, Occiput,, Anterior; Station: +4.  Delivery of the head: 12/16/2013  4:24 AM First maneuver: 12/16/2013  4:24 AM, Suprapubic Pressure McRoberts Second maneuver: 12/16/2013  4:25 AM, Joseph ArtWoods screw (fetal shoulder rotation)   Verbal consent: unable to obtain verbal consent due to emergent situation.  APGAR: 7, 8; weight .   Placenta status: , .   Cord: 3 vessels with the following complications: .  Cord pH: 7.316  Anesthesia: Epidural  Episiotomy: None Lacerations: Sulcal tear Suture Repair: 3.0 monocryl on CT Est. Blood Loss (mL): 250  Came to room with infant crowning. Applied ritgens with successful delivery of head. Immediately apparent of shoulder. Applied mcroberts and suprapubic pressure. No signicant resolution. Dr. Despina HiddenEure returned to room and Rotated infant with wood's screw and successfully delivered infant. Infant taken to warmer and successfully resussed with reassuring apgars and PH. Active managmenet of 3rd stge with pit and traction. Repaired sulcal tear with 1 figure of 8. And hemostatic. EBL 250cc counts correct. Approx 2min  Mom to postpartum.  Baby to Couplet care / Skin to Skin. 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  Kelly Collier, Kelly Collier 12/16/2013, 4:52 AM

## 2013-11-09 IMAGING — US US OB FOLLOW-UP
1 series · 13 of 28 positions shown · non-contrast
Comparison: none

[Series 1: us ob follow up · 44 acquisitions, 13 frames shown]
[im 2/44]
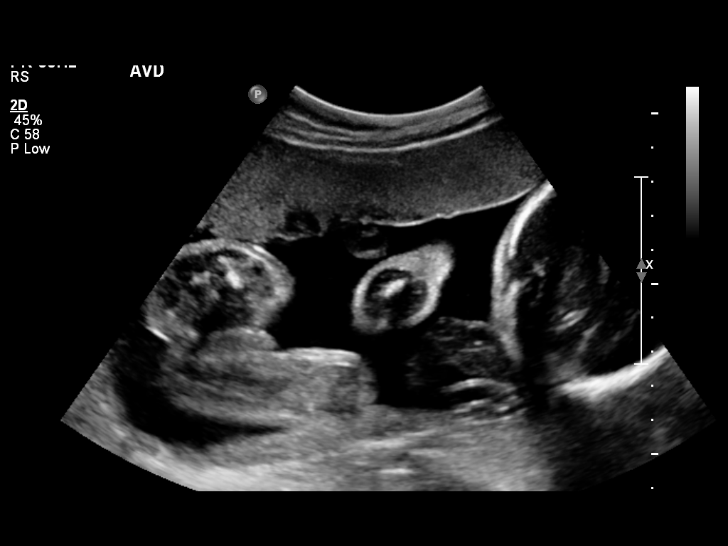
[im 5/44]
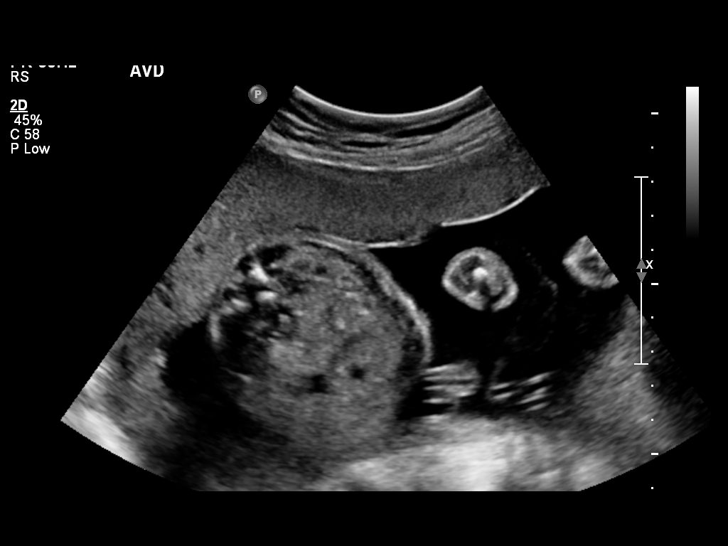
[im 8/44]
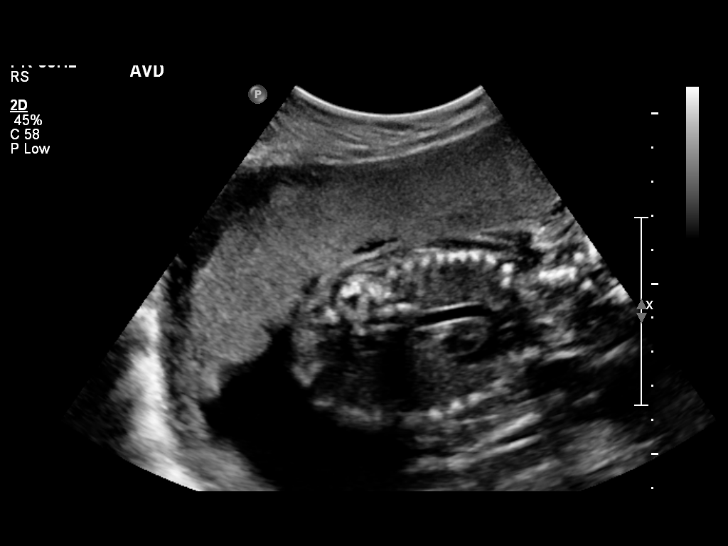
[im 12/44]
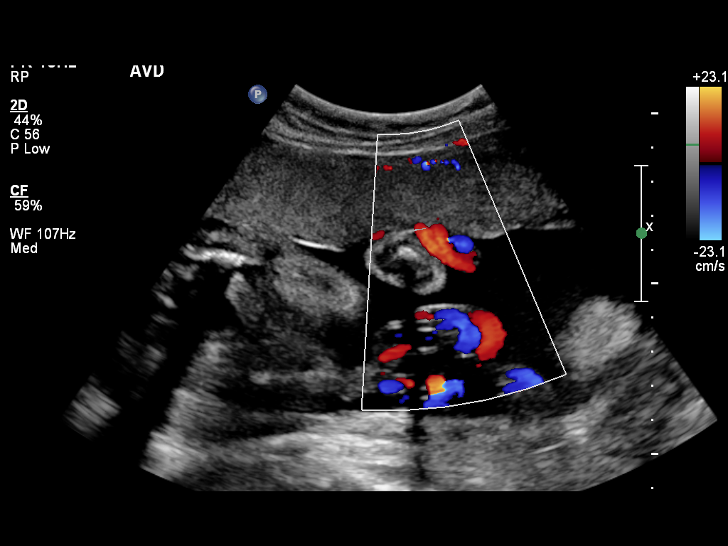
[im 15/44]
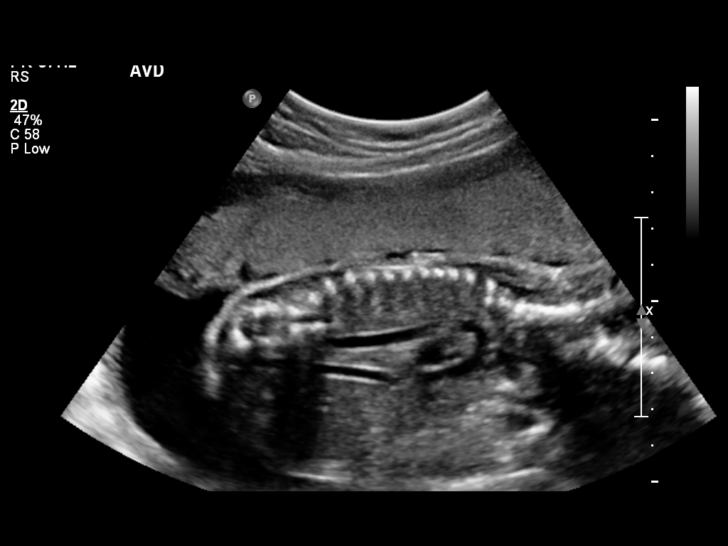
[im 18/44]
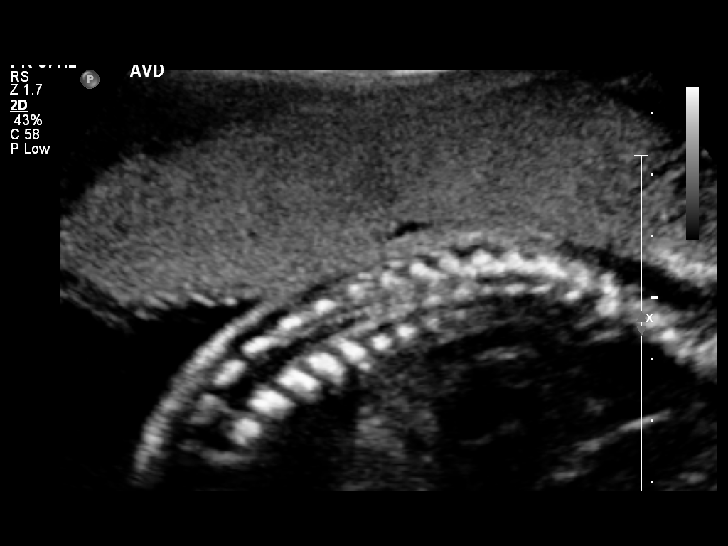
[im 23/44]
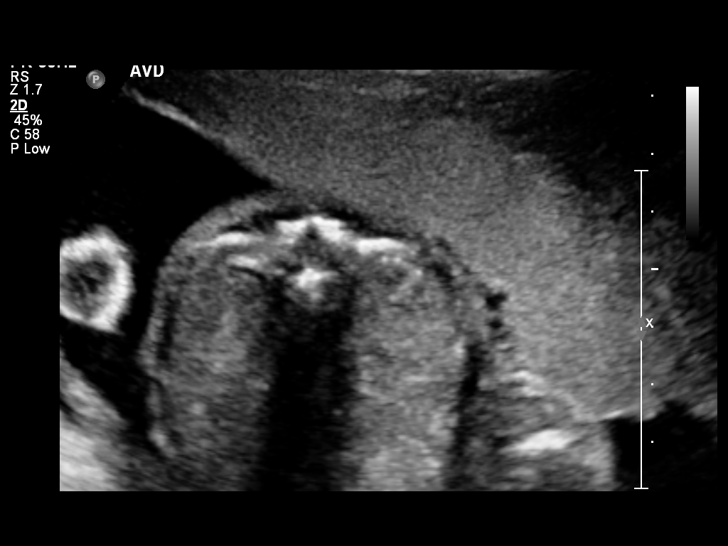
[im 26/44]
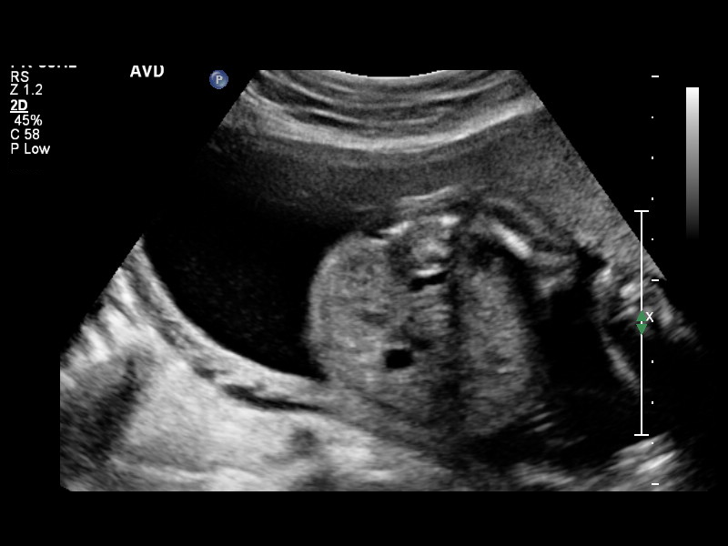
[im 29/44]
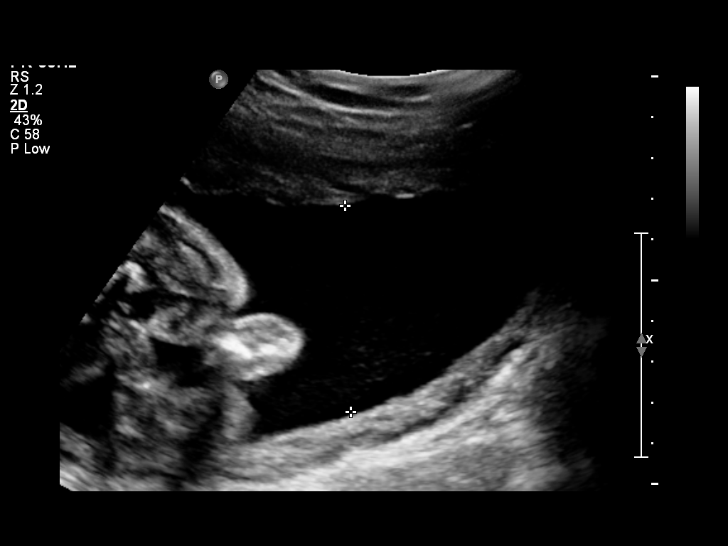
[im 32/44]
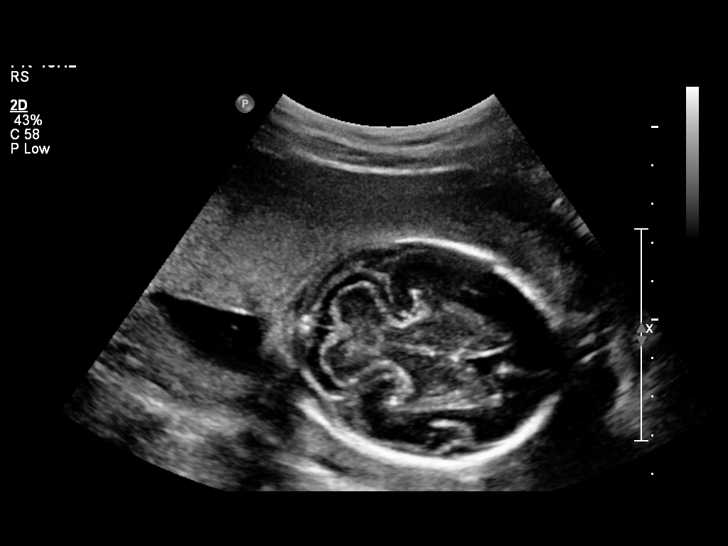
[im 36/44]
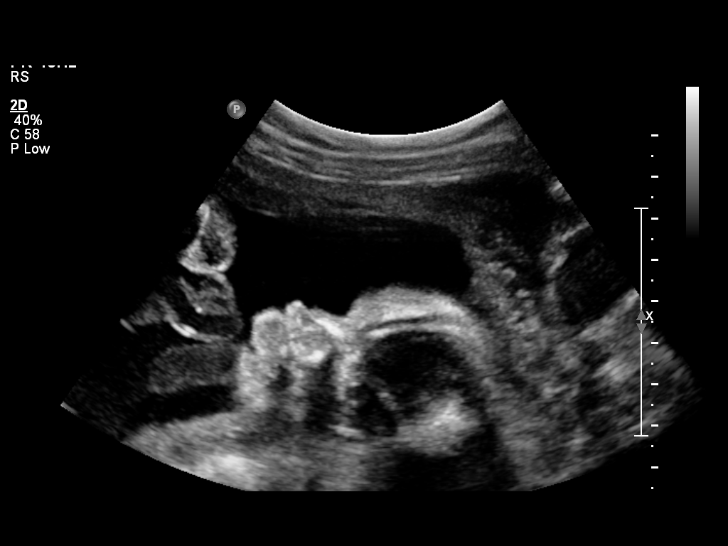
[im 39/44]
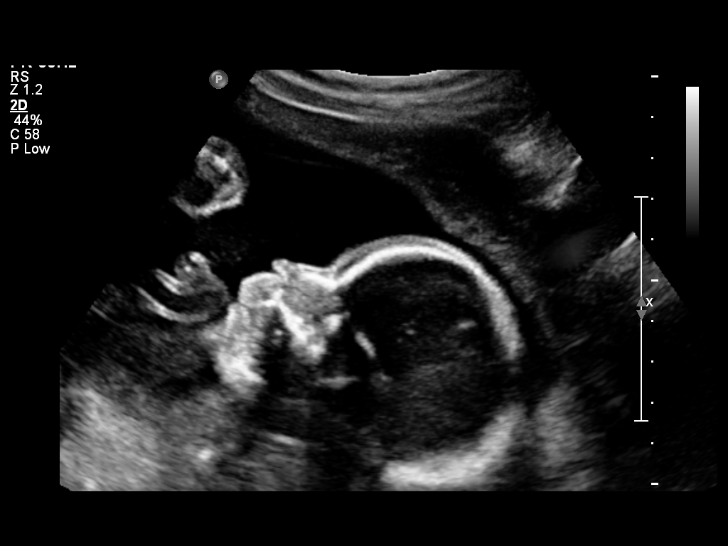
[im 42/44]
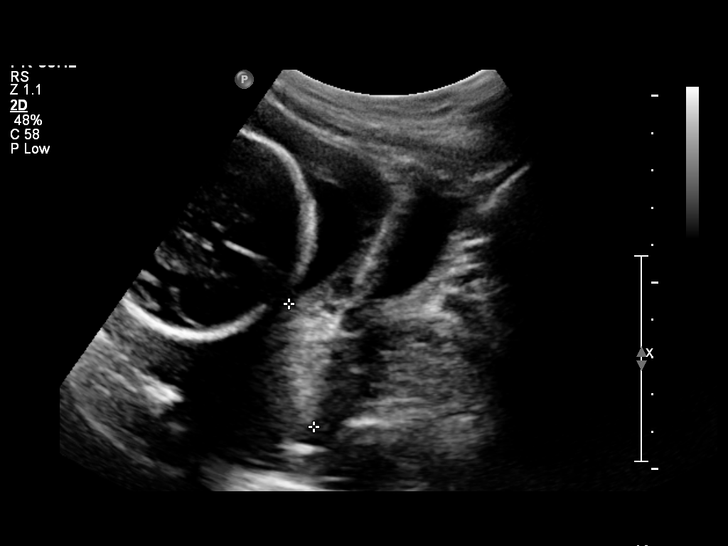

[13 of 28 positions shown; findings below may reference images not displayed]

OBSTETRICS REPORT
                      (Signed Final 08/12/2013 [DATE])

Service(s) Provided

 US OB FOLLOW UP                                       76816.1
Indications

 Follow-up incomplete fetal anatomic evaluation
Fetal Evaluation

 Num Of Fetuses:    1
 Fetal Heart Rate:  149                          bpm
 Cardiac Activity:  Observed
 Presentation:      Cephalic
 Placenta:          Anterior, above cervical os

 Amniotic Fluid
 AFI FV:      Subjectively within normal limits
                                             Larg Pckt:     5.0  cm
Biometry

 BPD:     57.1  mm     G. Age:  23w 3d                CI:         75.2   70 - 86
 OFD:     75.9  mm                                    FL/HC:      19.1   19.2 -

 HC:     215.6  mm     G. Age:  23w 4d       54  %    HC/AC:      1.12   1.05 -

 AC:     191.8  mm     G. Age:  23w 6d       66  %    FL/BPD:     72.2   71 - 87
 FL:      41.2  mm     G. Age:  23w 3d       47  %    FL/AC:      21.5   20 - 24

 Est. FW:     616  gm      1 lb 6 oz     61  %
Gestational Age

 LMP:           23w 1d        Date:  03/03/13                 EDD:   12/08/13
 U/S Today:     23w 4d                                        EDD:   12/05/13
 Best:          23w 1d     Det. By:  LMP  (03/03/13)          EDD:   12/08/13
Anatomy

 Cranium:          Appears normal         Aortic Arch:      Appears normal
 Fetal Cavum:      Appears normal         Ductal Arch:      Previously seen
 Ventricles:       Appears normal         Diaphragm:        Appears normal
 Choroid Plexus:   Previously seen        Stomach:          Appears normal
 Cerebellum:       Appears normal         Abdomen:          Appears normal
 Posterior Fossa:  Appears normal         Abdominal Wall:   Previously seen
 Nuchal Fold:      Previously seen        Cord Vessels:     Appears normal (3
                                                            vessel cord)
 Face:             Appears normal         Kidneys:          Appear normal
                   (orbits and profile)
 Lips:             Appears normal         Bladder:          Appears normal
 Heart:            Previously seen        Spine:            Appears normal
 RVOT:             Previously seen        Lower             Previously seen
                                          Extremities:
 LVOT:             Previously seen        Upper             Previously seen
                                          Extremities:

 Other:  Heels and 5th digit previously seen.
Cervix Uterus Adnexa

 Cervical Length:    3.34     cm

 Cervix:       Normal appearance by transabdominal scan.
Impression

 Active SIUP at 23w7d on follow up evaluation to compete
 fetal survey
 EFW is at the 61st percentile
 AFI is gestational age appropriate
 No previa
 Today's images effectively complete fetal survey without
 apparent dysmorphic features
Recommendations

 Follow up ultrasounds as clinically indicated.

## 2013-11-14 ENCOUNTER — Ambulatory Visit (INDEPENDENT_AMBULATORY_CARE_PROVIDER_SITE_OTHER): Payer: Medicaid Other | Admitting: Advanced Practice Midwife

## 2013-11-14 ENCOUNTER — Encounter: Payer: Self-pay | Admitting: Advanced Practice Midwife

## 2013-11-14 VITALS — BP 125/75 | Temp 97.8°F | Wt 157.4 lb

## 2013-11-14 DIAGNOSIS — O98819 Other maternal infectious and parasitic diseases complicating pregnancy, unspecified trimester: Secondary | ICD-10-CM

## 2013-11-14 DIAGNOSIS — Z3403 Encounter for supervision of normal first pregnancy, third trimester: Secondary | ICD-10-CM

## 2013-11-14 LAB — OB RESULTS CONSOLE GBS: GBS: POSITIVE

## 2013-11-14 LAB — OB RESULTS CONSOLE GC/CHLAMYDIA
CHLAMYDIA, DNA PROBE: NEGATIVE
GC PROBE AMP, GENITAL: NEGATIVE

## 2013-11-14 NOTE — Progress Notes (Signed)
Doing well. Has a few contractions. GBS and cultures done. Labor precautions reviewed

## 2013-11-14 NOTE — Patient Instructions (Signed)

## 2013-11-14 NOTE — Progress Notes (Signed)
Pulse: 93

## 2013-11-15 LAB — POCT URINALYSIS DIP (DEVICE)
BILIRUBIN URINE: NEGATIVE
GLUCOSE, UA: NEGATIVE mg/dL
Hgb urine dipstick: NEGATIVE
Ketones, ur: NEGATIVE mg/dL
NITRITE: NEGATIVE
Protein, ur: NEGATIVE mg/dL
Specific Gravity, Urine: 1.02 (ref 1.005–1.030)
UROBILINOGEN UA: 0.2 mg/dL (ref 0.0–1.0)
pH: 6 (ref 5.0–8.0)

## 2013-11-15 LAB — GC/CHLAMYDIA PROBE AMP
CT PROBE, AMP APTIMA: NEGATIVE
GC PROBE AMP APTIMA: NEGATIVE

## 2013-11-17 LAB — CULTURE, BETA STREP (GROUP B ONLY)

## 2013-11-18 ENCOUNTER — Encounter: Payer: Self-pay | Admitting: Advanced Practice Midwife

## 2013-11-18 DIAGNOSIS — B951 Streptococcus, group B, as the cause of diseases classified elsewhere: Secondary | ICD-10-CM | POA: Insufficient documentation

## 2013-11-18 DIAGNOSIS — O98819 Other maternal infectious and parasitic diseases complicating pregnancy, unspecified trimester: Secondary | ICD-10-CM

## 2013-11-20 ENCOUNTER — Ambulatory Visit (INDEPENDENT_AMBULATORY_CARE_PROVIDER_SITE_OTHER): Payer: Medicaid Other | Admitting: Advanced Practice Midwife

## 2013-11-20 VITALS — BP 121/80 | Temp 97.9°F | Wt 160.5 lb

## 2013-11-20 DIAGNOSIS — Z3400 Encounter for supervision of normal first pregnancy, unspecified trimester: Secondary | ICD-10-CM

## 2013-11-20 DIAGNOSIS — O2390 Unspecified genitourinary tract infection in pregnancy, unspecified trimester: Secondary | ICD-10-CM

## 2013-11-20 LAB — POCT URINALYSIS DIP (DEVICE)
Bilirubin Urine: NEGATIVE
Glucose, UA: NEGATIVE mg/dL
Hgb urine dipstick: NEGATIVE
Ketones, ur: NEGATIVE mg/dL
Nitrite: NEGATIVE
PH: 7 (ref 5.0–8.0)
PROTEIN: NEGATIVE mg/dL
Specific Gravity, Urine: 1.015 (ref 1.005–1.030)
UROBILINOGEN UA: 0.2 mg/dL (ref 0.0–1.0)

## 2013-11-20 NOTE — Patient Instructions (Signed)

## 2013-11-20 NOTE — Progress Notes (Signed)
Kelly MarketKarissa Pieri is a 23 y.o. G1P0 female at 5058w3d gestation - here for weekly OB visit.  being seen today for her obstetrical visit. She is at 5458w3d gestation. Patient reports no complaints. Fetal movement: normal.  Denies vaginal bleeding, leakage of fluids, and contractions.

## 2013-11-20 NOTE — Progress Notes (Signed)
P=104,

## 2013-11-20 NOTE — Progress Notes (Signed)
Doing well. Discussed circumcision issues. They will check with business office RE: insurance coverage.

## 2013-11-22 ENCOUNTER — Encounter: Payer: Medicaid Other | Admitting: Family Medicine

## 2013-11-28 ENCOUNTER — Ambulatory Visit (INDEPENDENT_AMBULATORY_CARE_PROVIDER_SITE_OTHER): Payer: Medicaid Other | Admitting: Advanced Practice Midwife

## 2013-11-28 VITALS — BP 126/74 | Temp 97.2°F | Wt 162.1 lb

## 2013-11-28 DIAGNOSIS — R6889 Other general symptoms and signs: Secondary | ICD-10-CM

## 2013-11-28 DIAGNOSIS — IMO0002 Reserved for concepts with insufficient information to code with codable children: Secondary | ICD-10-CM

## 2013-11-28 LAB — POCT URINALYSIS DIP (DEVICE)
Bilirubin Urine: NEGATIVE
GLUCOSE, UA: NEGATIVE mg/dL
HGB URINE DIPSTICK: NEGATIVE
Ketones, ur: NEGATIVE mg/dL
NITRITE: NEGATIVE
PROTEIN: NEGATIVE mg/dL
SPECIFIC GRAVITY, URINE: 1.02 (ref 1.005–1.030)
UROBILINOGEN UA: 0.2 mg/dL (ref 0.0–1.0)
pH: 7 (ref 5.0–8.0)

## 2013-11-28 NOTE — Progress Notes (Signed)
Pulse: 88

## 2013-11-28 NOTE — Progress Notes (Signed)
Doing well.  Good fetal movement, denies vaginal bleeding, LOF, regular contractions.  

## 2013-12-05 ENCOUNTER — Ambulatory Visit (INDEPENDENT_AMBULATORY_CARE_PROVIDER_SITE_OTHER): Payer: Medicaid Other | Admitting: Obstetrics & Gynecology

## 2013-12-05 ENCOUNTER — Encounter: Payer: Self-pay | Admitting: Obstetrics & Gynecology

## 2013-12-05 VITALS — BP 127/85 | Temp 96.7°F | Wt 167.6 lb

## 2013-12-05 DIAGNOSIS — Z3480 Encounter for supervision of other normal pregnancy, unspecified trimester: Secondary | ICD-10-CM

## 2013-12-05 DIAGNOSIS — O98819 Other maternal infectious and parasitic diseases complicating pregnancy, unspecified trimester: Secondary | ICD-10-CM

## 2013-12-05 LAB — POCT URINALYSIS DIP (DEVICE)
BILIRUBIN URINE: NEGATIVE
GLUCOSE, UA: NEGATIVE mg/dL
Hgb urine dipstick: NEGATIVE
Ketones, ur: NEGATIVE mg/dL
Nitrite: NEGATIVE
PROTEIN: NEGATIVE mg/dL
SPECIFIC GRAVITY, URINE: 1.02 (ref 1.005–1.030)
Urobilinogen, UA: 0.2 mg/dL (ref 0.0–1.0)
pH: 6.5 (ref 5.0–8.0)

## 2013-12-05 NOTE — Patient Instructions (Signed)
Contraception Choices Contraception (birth control) is the use of any methods or devices to prevent pregnancy. Below are some methods to help avoid pregnancy. HORMONAL METHODS   Contraceptive implant This is a thin, plastic tube containing progesterone hormone. It does not contain estrogen hormone. Your health care provider inserts the tube in the inner part of the upper arm. The tube can remain in place for up to 3 years. After 3 years, the implant must be removed. The implant prevents the ovaries from releasing an egg (ovulation), thickens the cervical mucus to prevent sperm from entering the uterus, and thins the lining of the inside of the uterus.  Progesterone-only injections These injections are given every 3 months by your health care provider to prevent pregnancy. This synthetic progesterone hormone stops the ovaries from releasing eggs. It also thickens cervical mucus and changes the uterine lining. This makes it harder for sperm to survive in the uterus.  Birth control pills These pills contain estrogen and progesterone hormone. They work by preventing the ovaries from releasing eggs (ovulation). They also cause the cervical mucus to thicken, preventing the sperm from entering the uterus. Birth control pills are prescribed by a health care provider.Birth control pills can also be used to treat heavy periods.  Minipill This type of birth control pill contains only the progesterone hormone. They are taken every day of each month and must be prescribed by your health care provider.  Birth control patch The patch contains hormones similar to those in birth control pills. It must be changed once a week and is prescribed by a health care provider.  Vaginal ring The ring contains hormones similar to those in birth control pills. It is left in the vagina for 3 weeks, removed for 1 week, and then a new one is put back in place. The patient must be comfortable inserting and removing the ring from the  vagina.A health care provider's prescription is necessary.  Emergency contraception Emergency contraceptives prevent pregnancy after unprotected sexual intercourse. This pill can be taken right after sex or up to 5 days after unprotected sex. It is most effective the sooner you take the pills after having sexual intercourse. Most emergency contraceptive pills are available without a prescription. Check with your pharmacist. Do not use emergency contraception as your only form of birth control. BARRIER METHODS   Female condom This is a thin sheath (latex or rubber) that is worn over the penis during sexual intercourse. It can be used with spermicide to increase effectiveness.  Female condom. This is a soft, loose-fitting sheath that is put into the vagina before sexual intercourse.  Diaphragm This is a soft, latex, dome-shaped barrier that must be fitted by a health care provider. It is inserted into the vagina, along with a spermicidal jelly. It is inserted before intercourse. The diaphragm should be left in the vagina for 6 to 8 hours after intercourse.  Cervical cap This is a round, soft, latex or plastic cup that fits over the cervix and must be fitted by a health care provider. The cap can be left in place for up to 48 hours after intercourse.  Sponge This is a soft, circular piece of polyurethane foam. The sponge has spermicide in it. It is inserted into the vagina after wetting it and before sexual intercourse.  Spermicides These are chemicals that kill or block sperm from entering the cervix and uterus. They come in the form of creams, jellies, suppositories, foam, or tablets. They do not require a   prescription. They are inserted into the vagina with an applicator before having sexual intercourse. The process must be repeated every time you have sexual intercourse. INTRAUTERINE CONTRACEPTION  Intrauterine device (IUD) This is a T-shaped device that is put in a woman's uterus during a  menstrual period to prevent pregnancy. There are 2 types:  Copper IUD This type of IUD is wrapped in copper wire and is placed inside the uterus. Copper makes the uterus and fallopian tubes produce a fluid that kills sperm. It can stay in place for 10 years.  Hormone IUD This type of IUD contains the hormone progestin (synthetic progesterone). The hormone thickens the cervical mucus and prevents sperm from entering the uterus, and it also thins the uterine lining to prevent implantation of a fertilized egg. The hormone can weaken or kill the sperm that get into the uterus. It can stay in place for 3 5 years, depending on which type of IUD is used. PERMANENT METHODS OF CONTRACEPTION  Female tubal ligation This is when the woman's fallopian tubes are surgically sealed, tied, or blocked to prevent the egg from traveling to the uterus.  Hysteroscopic sterilization This involves placing a small coil or insert into each fallopian tube. Your doctor uses a technique called hysteroscopy to do the procedure. The device causes scar tissue to form. This results in permanent blockage of the fallopian tubes, so the sperm cannot fertilize the egg. It takes about 3 months after the procedure for the tubes to become blocked. You must use another form of birth control for these 3 months.  Female sterilization This is when the female has the tubes that carry sperm tied off (vasectomy).This blocks sperm from entering the vagina during sexual intercourse. After the procedure, the man can still ejaculate fluid (semen). NATURAL PLANNING METHODS  Natural family planning This is not having sexual intercourse or using a barrier method (condom, diaphragm, cervical cap) on days the woman could become pregnant.  Calendar method This is keeping track of the length of each menstrual cycle and identifying when you are fertile.  Ovulation method This is avoiding sexual intercourse during ovulation.  Symptothermal method This is  avoiding sexual intercourse during ovulation, using a thermometer and ovulation symptoms.  Post ovulation method This is timing sexual intercourse after you have ovulated. Regardless of which type or method of contraception you choose, it is important that you use condoms to protect against the transmission of sexually transmitted infections (STIs). Talk with your health care provider about which form of contraception is most appropriate for you. Document Released: 10/24/2005 Document Revised: 06/26/2013 Document Reviewed: 04/18/2013 [employer] Patient Information 2014 [employer].  

## 2013-12-05 NOTE — Progress Notes (Signed)
Pulse: 85

## 2013-12-05 NOTE — Progress Notes (Signed)
Pt with no complaints.  Undecided re contraception PP- plans abstinence. Labor precautions

## 2013-12-12 ENCOUNTER — Ambulatory Visit (INDEPENDENT_AMBULATORY_CARE_PROVIDER_SITE_OTHER): Payer: Medicaid Other | Admitting: Obstetrics & Gynecology

## 2013-12-12 VITALS — BP 127/86 | Temp 96.9°F | Wt 171.6 lb

## 2013-12-12 DIAGNOSIS — IMO0002 Reserved for concepts with insufficient information to code with codable children: Secondary | ICD-10-CM

## 2013-12-12 DIAGNOSIS — R6889 Other general symptoms and signs: Secondary | ICD-10-CM

## 2013-12-12 DIAGNOSIS — O480 Post-term pregnancy: Secondary | ICD-10-CM

## 2013-12-12 DIAGNOSIS — Z3400 Encounter for supervision of normal first pregnancy, unspecified trimester: Secondary | ICD-10-CM

## 2013-12-12 LAB — POCT URINALYSIS DIP (DEVICE)
BILIRUBIN URINE: NEGATIVE
GLUCOSE, UA: NEGATIVE mg/dL
Ketones, ur: NEGATIVE mg/dL
Nitrite: NEGATIVE
Protein, ur: NEGATIVE mg/dL
SPECIFIC GRAVITY, URINE: 1.025 (ref 1.005–1.030)
UROBILINOGEN UA: 0.2 mg/dL (ref 0.0–1.0)
pH: 6 (ref 5.0–8.0)

## 2013-12-12 NOTE — Progress Notes (Signed)
No problems.  Induction set for 41 weeks.  Membranes stripped.

## 2013-12-12 NOTE — Progress Notes (Signed)
IOl scheduled 12/15/13 at 730 am.

## 2013-12-12 NOTE — Progress Notes (Signed)
Pulse- 76  Pressure-vaginal  Edema-feet

## 2013-12-13 ENCOUNTER — Telehealth (HOSPITAL_COMMUNITY): Payer: Self-pay | Admitting: *Deleted

## 2013-12-13 NOTE — Telephone Encounter (Signed)
Preadmission screen  

## 2013-12-15 ENCOUNTER — Encounter (HOSPITAL_COMMUNITY): Payer: Self-pay

## 2013-12-15 ENCOUNTER — Encounter (HOSPITAL_COMMUNITY): Payer: Medicaid Other | Admitting: Anesthesiology

## 2013-12-15 ENCOUNTER — Inpatient Hospital Stay (HOSPITAL_COMMUNITY): Payer: Medicaid Other | Admitting: Anesthesiology

## 2013-12-15 ENCOUNTER — Inpatient Hospital Stay (HOSPITAL_COMMUNITY)
Admission: RE | Admit: 2013-12-15 | Discharge: 2013-12-18 | DRG: 775 | Disposition: A | Discharge status: Home / Self Care | Payer: Medicaid Other | Source: Ambulatory Visit | Attending: Obstetrics & Gynecology | Admitting: Obstetrics & Gynecology

## 2013-12-15 VITALS — BP 110/71 | HR 89 | Temp 98.3°F | Resp 18 | Ht 61.0 in | Wt 171.0 lb

## 2013-12-15 DIAGNOSIS — Z22330 Carrier of Group B streptococcus: Secondary | ICD-10-CM

## 2013-12-15 DIAGNOSIS — O98819 Other maternal infectious and parasitic diseases complicating pregnancy, unspecified trimester: Secondary | ICD-10-CM

## 2013-12-15 DIAGNOSIS — Z3400 Encounter for supervision of normal first pregnancy, unspecified trimester: Secondary | ICD-10-CM

## 2013-12-15 DIAGNOSIS — O333XX0 Maternal care for disproportion due to outlet contraction of pelvis, not applicable or unspecified: Secondary | ICD-10-CM | POA: Diagnosis present

## 2013-12-15 DIAGNOSIS — O480 Post-term pregnancy: Secondary | ICD-10-CM | POA: Diagnosis present

## 2013-12-15 DIAGNOSIS — O09299 Supervision of pregnancy with other poor reproductive or obstetric history, unspecified trimester: Secondary | ICD-10-CM

## 2013-12-15 DIAGNOSIS — O99892 Other specified diseases and conditions complicating childbirth: Secondary | ICD-10-CM | POA: Diagnosis present

## 2013-12-15 DIAGNOSIS — O9989 Other specified diseases and conditions complicating pregnancy, childbirth and the puerperium: Secondary | ICD-10-CM

## 2013-12-15 DIAGNOSIS — B951 Streptococcus, group B, as the cause of diseases classified elsewhere: Secondary | ICD-10-CM

## 2013-12-15 LAB — CBC
HCT: 34.4 % — ABNORMAL LOW (ref 36.0–46.0)
HEMOGLOBIN: 11.8 g/dL — AB (ref 12.0–15.0)
MCH: 32 pg (ref 26.0–34.0)
MCHC: 34.3 g/dL (ref 30.0–36.0)
MCV: 93.2 fL (ref 78.0–100.0)
Platelets: 178 10*3/uL (ref 150–400)
RBC: 3.69 MIL/uL — ABNORMAL LOW (ref 3.87–5.11)
RDW: 12.8 % (ref 11.5–15.5)
WBC: 15.1 10*3/uL — AB (ref 4.0–10.5)

## 2013-12-15 LAB — RPR: RPR: NONREACTIVE

## 2013-12-15 MED ORDER — FENTANYL 2.5 MCG/ML BUPIVACAINE 1/10 % EPIDURAL INFUSION (WH - ANES)
INTRAMUSCULAR | Status: DC | PRN
  Administered 2013-12-15: 14 mL/h via EPIDURAL

## 2013-12-15 MED ORDER — LACTATED RINGERS IV SOLN
500.0000 mL | INTRAVENOUS | Status: DC | PRN
  Administered 2013-12-15: 1000 mL via INTRAVENOUS

## 2013-12-15 MED ORDER — TERBUTALINE SULFATE 1 MG/ML IJ SOLN: 0.2500 mg | Freq: Once | INTRAMUSCULAR | Status: AC | PRN

## 2013-12-15 MED ORDER — LACTATED RINGERS IV SOLN
INTRAVENOUS | Status: DC
  Administered 2013-12-15 (×3): via INTRAVENOUS

## 2013-12-15 MED ORDER — LACTATED RINGERS IV SOLN
500.0000 mL | Freq: Once | INTRAVENOUS | Status: DC
Start: 2013-12-15 — End: 2013-12-16

## 2013-12-15 MED ORDER — PHENYLEPHRINE 40 MCG/ML (10ML) SYRINGE FOR IV PUSH (FOR BLOOD PRESSURE SUPPORT): 80.0000 ug | PREFILLED_SYRINGE | INTRAVENOUS | Status: DC | PRN

## 2013-12-15 MED ORDER — PHENYLEPHRINE 40 MCG/ML (10ML) SYRINGE FOR IV PUSH (FOR BLOOD PRESSURE SUPPORT)
PREFILLED_SYRINGE | INTRAVENOUS | Status: AC
  Filled 2013-12-15: qty 10

## 2013-12-15 MED ORDER — ACETAMINOPHEN 325 MG PO TABS: 650.0000 mg | ORAL_TABLET | ORAL | Status: DC | PRN

## 2013-12-15 MED ORDER — FLEET ENEMA 7-19 GM/118ML RE ENEM: 1.0000 | ENEMA | RECTAL | Status: DC | PRN

## 2013-12-15 MED ORDER — EPHEDRINE 5 MG/ML INJ: 10.0000 mg | INTRAVENOUS | Status: DC | PRN

## 2013-12-15 MED ORDER — IBUPROFEN 600 MG PO TABS: 600.0000 mg | ORAL_TABLET | Freq: Four times a day (QID) | ORAL | Status: DC | PRN

## 2013-12-15 MED ORDER — PENICILLIN G POTASSIUM 5000000 UNITS IJ SOLR
2.5000 10*6.[IU] | INTRAVENOUS | Status: DC
  Administered 2013-12-15 – 2013-12-16 (×4): 2.5 10*6.[IU] via INTRAVENOUS
  Filled 2013-12-15 (×8): qty 2.5

## 2013-12-15 MED ORDER — LIDOCAINE HCL (PF) 1 % IJ SOLN
30.0000 mL | INTRAMUSCULAR | Status: DC | PRN
  Filled 2013-12-15: qty 30

## 2013-12-15 MED ORDER — OXYTOCIN BOLUS FROM INFUSION
500.0000 mL | INTRAVENOUS | Status: DC
  Administered 2013-12-16: 500 mL via INTRAVENOUS

## 2013-12-15 MED ORDER — FENTANYL CITRATE 0.05 MG/ML IJ SOLN
100.0000 ug | INTRAMUSCULAR | Status: DC | PRN
  Administered 2013-12-15 (×2): 100 ug via INTRAVENOUS
  Filled 2013-12-15 (×2): qty 2

## 2013-12-15 MED ORDER — FENTANYL 2.5 MCG/ML BUPIVACAINE 1/10 % EPIDURAL INFUSION (WH - ANES)
14.0000 mL/h | INTRAMUSCULAR | Status: DC | PRN
  Administered 2013-12-16: 14 mL/h via EPIDURAL
  Filled 2013-12-15: qty 125

## 2013-12-15 MED ORDER — OXYTOCIN 40 UNITS IN LACTATED RINGERS INFUSION - SIMPLE MED: 62.5000 mL/h | INTRAVENOUS | Status: DC

## 2013-12-15 MED ORDER — OXYCODONE-ACETAMINOPHEN 5-325 MG PO TABS: 1.0000 | ORAL_TABLET | ORAL | Status: DC | PRN

## 2013-12-15 MED ORDER — LIDOCAINE HCL (PF) 1 % IJ SOLN
INTRAMUSCULAR | Status: DC | PRN
  Administered 2013-12-15 (×3): 5 mL

## 2013-12-15 MED ORDER — EPHEDRINE 5 MG/ML INJ
INTRAVENOUS | Status: AC
  Filled 2013-12-15: qty 4

## 2013-12-15 MED ORDER — DIPHENHYDRAMINE HCL 50 MG/ML IJ SOLN: 12.5000 mg | INTRAMUSCULAR | Status: DC | PRN

## 2013-12-15 MED ORDER — ONDANSETRON HCL 4 MG/2ML IJ SOLN: 4.0000 mg | Freq: Four times a day (QID) | INTRAMUSCULAR | Status: DC | PRN

## 2013-12-15 MED ORDER — PENICILLIN G POTASSIUM 5000000 UNITS IJ SOLR
5.0000 10*6.[IU] | Freq: Once | INTRAVENOUS | Status: AC
  Administered 2013-12-15: 5 10*6.[IU] via INTRAVENOUS
  Filled 2013-12-15: qty 5

## 2013-12-15 MED ORDER — CITRIC ACID-SODIUM CITRATE 334-500 MG/5ML PO SOLN: 30.0000 mL | ORAL | Status: DC | PRN

## 2013-12-15 MED ORDER — OXYTOCIN 40 UNITS IN LACTATED RINGERS INFUSION - SIMPLE MED
1.0000 m[IU]/min | INTRAVENOUS | Status: DC
  Administered 2013-12-15: 2 m[IU]/min via INTRAVENOUS
  Administered 2013-12-15: 4 m[IU]/min via INTRAVENOUS
  Administered 2013-12-15: 10 m[IU]/min via INTRAVENOUS
  Administered 2013-12-15: 6 m[IU]/min via INTRAVENOUS
  Administered 2013-12-15: 8 m[IU]/min via INTRAVENOUS
  Filled 2013-12-15: qty 1000

## 2013-12-15 MED ORDER — FENTANYL 2.5 MCG/ML BUPIVACAINE 1/10 % EPIDURAL INFUSION (WH - ANES)
INTRAMUSCULAR | Status: AC
  Filled 2013-12-15: qty 125

## 2013-12-15 NOTE — Progress Notes (Signed)
Kelly Collier is a 23 y.o. G1P0 at 3245w0d.  Subjective: More uncomfortable w/ UC's x 45 minutes. Breathing through UC's.  Objective: BP 125/82  Pulse 61  Temp(Src) 98 F (36.7 C) (Oral)  Resp 20  Ht 5\' 1"  (1.549 m)  Wt 77.565 kg (171 lb)  BMI 32.33 kg/m2  LMP 03/03/2013      FHT:  FHR: 150 bpm, variability: moderate,  accelerations:  Present,  decelerations:  Absent UC:   irregular, every 2-5 minutes, moderate SVE:   Dilation: 3.5 Effacement (%): 60 Station: -3 Exam by:: [place] Jodilyn Giese, CNM  Labs: Lab Results  Component Value Date   WBC 15.1* 12/15/2013   HGB 11.8* 12/15/2013   HCT 34.4* 12/15/2013   MCV 93.2 12/15/2013   PLT 178 12/15/2013    Assessment / Plan: Induction of labor due to postterm,  progressing well on pitocin  Labor: Progressing on Pitocin, will continue to increase then AROM Preeclampsia:  NA Fetal Wellbeing:  Category I Pain Control:  Labor support without medications I/D:  n/a Anticipated MOD:  NSVD  Kelly Collier 12/15/2013, 3:17 PM

## 2013-12-15 NOTE — Progress Notes (Signed)
Ivonne AndrewV Smith CNM notified by Delrae SawyersS Grindstaff RN that patient is here

## 2013-12-15 NOTE — Anesthesia Procedure Notes (Signed)
Epidural Patient location during procedure: OB  Staffing Anesthesiologist: Mate Alegria Performed by: anesthesiologist   Preanesthetic Checklist Completed: patient identified, site marked, surgical consent, pre-op evaluation, timeout performed, IV checked, risks and benefits discussed and monitors and equipment checked  Epidural Patient position: sitting Prep: ChloraPrep Patient monitoring: heart rate, continuous pulse ox and blood pressure Approach: right paramedian Injection technique: LOR saline  Needle:  Needle type: Tuohy  Needle gauge: 17 G Needle length: 9 cm and 9 Needle insertion depth: 5 cm Catheter type: closed end flexible Catheter size: 20 Guage Catheter at skin depth: 10 cm Test dose: negative  Assessment Events: blood not aspirated, injection not painful, no injection resistance, negative IV test and no paresthesia  Additional Notes   Patient tolerated the insertion well without complications.   

## 2013-12-15 NOTE — Anesthesia Preprocedure Evaluation (Signed)

## 2013-12-15 NOTE — Progress Notes (Signed)
Kelly Collier is a 23 y.o. G1P0 at 312w0d.  Subjective: Requesting epidural.  Objective: BP 104/64  Pulse 84  Temp(Src) 98.3 F (36.8 C) (Oral)  Resp 20  Ht 5\' 1"  (1.549 m)  Wt 77.565 kg (171 lb)  BMI 32.33 kg/m2  SpO2 99%  LMP 03/03/2013      FHT:  FHR: 135 bpm, variability: moderate,  accelerations:  Present,  decelerations:  Absent UC:   regular, every 2-3 minutes, strong SVE:   Dilation: 6 Effacement (%): 100 Station: -3 Exam by:: Paul Dykesina Feir RN  Labs: Lab Results  Component Value Date   WBC 15.1* 12/15/2013   HGB 11.8* 12/15/2013   HCT 34.4* 12/15/2013   MCV 93.2 12/15/2013   PLT 178 12/15/2013    Assessment / Plan: Induction of labor due to postterm,  progressing well on pitocin  Labor: Progressing on Pitocin, will continue to increase then AROM Preeclampsia:  NA Fetal Wellbeing:  Category I Pain Control:  May have epidural I/D:  n/a Anticipated MOD:  NSVD  Marcine Gadway 12/15/2013, 6:40 PM

## 2013-12-15 NOTE — H&P (Signed)
Chief Complaint:  No chief complaint on file.  HPI: Kelly Collier is a 23 y.o. year old G1P0 female at [redacted]w[redacted]d weeks gestation by LMP verified by 13 weeks [place] who presents for IOL for postdates. She received care at [facility]   Patient Active Problem List   Diagnosis Date Noted  . Post-dates pregnancy 12/15/2013  . Group B streptococcal infection in pregnancy 11/18/2013  . ASCUS with positive high risk HPV 05/27/2013  . Supervision of normal first pregnancy 05/17/2013    Clinic [facility]  Dating Criterion LMP c/w 12 week 3 day [place]  Genetic Screen 1 Screen:  NT 1.9/  nml             AFP:    ordered  But not in chart???        Anatomic [place] Normal   Glucose Screen Early: 126                  Third trimester:102  TDaP vaccine  Yes  Flu vaccine  Yes  GBS  Positive  Baby Food Breast  Contraception Undecided - has never been on birth control before, seems hesistant  Circumcision  Yes    Maternal Medical History:  Reason for admission: Nausea.    OB History   Grav Para Term Preterm Abortions TAB SAB Ect Mult Living   1              Past Medical History  Diagnosis Date  . Medical history non-contributory    Past Surgical History  Procedure Laterality Date  . No past surgeries     Family History: family history includes Diabetes in her father; Heart disease in her brother and father. Social History:  reports that she has never smoked. She has never used smokeless tobacco. She reports that she does not drink alcohol or use illicit drugs.   Prenatal Transfer Tool  Maternal Diabetes: No Genetic Screening: Normal Maternal Ultrasounds/Referrals: Normal Fetal Ultrasounds or other Referrals:  None Maternal Substance Abuse:  No Significant Maternal Medications:  None Significant Maternal Lab Results:  Lab values include: Group B Strep positive Other Comments:  None  Review of Systems  Constitutional: Negative for fever and chills.  Eyes: Negative for blurred vision and double vision.   Cardiovascular: Negative for chest pain.  Gastrointestinal: Negative for nausea, vomiting and abdominal pain.  Neurological: Negative for headaches.    Dilation: 3 Effacement (%): 50 Station: -3;-2 Exam by:: Paul Dykes RN Blood pressure 136/84, pulse 83, temperature 97.7 F (36.5 C), temperature source Oral, resp. rate 20, height 5\' 1"  (1.549 m), weight 77.565 kg (171 lb), last menstrual period 03/03/2013. Maternal Exam:  Uterine Assessment: Contraction strength is mild.  Contraction frequency is irregular.   Abdomen: Fundal height is S=D.   Fetal presentation: vertex  Introitus: Normal vulva. Pelvis: adequate for delivery.   Cervix: Cervix evaluated by digital exam.     Fetal Exam Fetal Monitor Review: Mode: ultrasound.   Baseline rate: 150.  Variability: moderate (6-25 bpm).   Pattern: accelerations present and no decelerations.    Fetal State Assessment: Category I - tracings are normal.     Physical Exam  Nursing note and vitals reviewed. Constitutional: She is oriented to person, place, and time. She appears well-developed and well-nourished. No distress.  Eyes: Conjunctivae are normal.  Cardiovascular: Normal rate and regular rhythm.   Respiratory: Effort normal and breath sounds normal.  GI: Soft. There is no tenderness.  Genitourinary: Vagina normal.  Musculoskeletal: Normal range of  motion. She exhibits no edema and no tenderness.  Neurological: She is alert and oriented to person, place, and time. She has normal reflexes.  Skin: Skin is warm and dry.  Psychiatric: She has a normal mood and affect.    Prenatal labs: ABO, Rh: O/POS/-- (07/11 0945) Antibody: NEG (07/11 0945) Rubella: 1.67 (07/11 0945) RPR: NON REAC (10/29 1010)  HBsAg: NEGATIVE (07/11 0945)  HIV: NON REACTIVE (10/29 1010)  GBS: Positive (01/08 0000)  First trimester screen normal 1 hour GTT 102  Assessment: 1. Labor: IOL for postdates 2. Fetal Wellbeing: Category I  3. Pain Control:  None 4. GBS: pos 5. 41.0 week IUP  Plan:  1. Admit to BS per consult with MD 2. Routine L&D orders 3. Analgesia/anesthesia PRN  4. PCN 5. Pitocin  Kelly Collier 12/15/2013, 11:52 AM

## 2013-12-15 NOTE — Progress Notes (Signed)
Kelly Collier is a 23 y.o. G1P0 at 1188w0d by LMP verified by US admitted for induction of labor due to Post dates.   Subjective: Comfortable. No pain.   Objective: BP 122/82  Pulse 101  Temp(Src) 100 F (37.8 C) (Oral)  Resp 20  Ht 5\' 1"  (1.549 m)  Wt 77.565 kg (171 lb)  BMI 32.33 kg/m2  SpO2 99%  LMP 03/03/2013   Total I/O In: -  Out: 500 [Urine:500]  FHT:  FHR: 150 bpm, variability: moderate,  accelerations:  Present,  decelerations:  Present 1 early UC:   regular, every 2 minutes SVE:   Dilation: Lip/rim Effacement (%): 100 Cervical Position: Anterior Station: 0 Presentation: Vertex Exam by:: Dr. Miguel Collier (Dr. Alonna Collier)   Labs: Lab Results  Component Value Date   WBC 15.1* 12/15/2013   HGB 11.8* 12/15/2013   HCT 34.4* 12/15/2013   MCV 93.2 12/15/2013   PLT 178 12/15/2013    Assessment / Plan: Induction of labor due to postterm,  progressing well on pitocin  Labor: Progressing well on pit. AROM now Preeclampsia:  Bp wnl Fetal Wellbeing:  Category I Pain Control:  Epidural I/D:  GBS pos- PCN Anticipated MOD:  NSVD  Kelly Collier, Kelly Collier 12/15/2013, 10:13 PM

## 2013-12-16 ENCOUNTER — Encounter (HOSPITAL_COMMUNITY): Payer: Self-pay

## 2013-12-16 DIAGNOSIS — O9989 Other specified diseases and conditions complicating pregnancy, childbirth and the puerperium: Secondary | ICD-10-CM

## 2013-12-16 DIAGNOSIS — O480 Post-term pregnancy: Secondary | ICD-10-CM

## 2013-12-16 DIAGNOSIS — O99892 Other specified diseases and conditions complicating childbirth: Secondary | ICD-10-CM

## 2013-12-16 MED ORDER — DIPHENHYDRAMINE HCL 25 MG PO CAPS: 25.0000 mg | ORAL_CAPSULE | Freq: Four times a day (QID) | ORAL | Status: DC | PRN

## 2013-12-16 MED ORDER — OXYCODONE-ACETAMINOPHEN 5-325 MG PO TABS
1.0000 | ORAL_TABLET | ORAL | Status: DC | PRN
Start: 2013-12-16 — End: 2013-12-18

## 2013-12-16 MED ORDER — DIBUCAINE 1 % RE OINT: 1.0000 "application " | TOPICAL_OINTMENT | RECTAL | Status: DC | PRN

## 2013-12-16 MED ORDER — ZOLPIDEM TARTRATE 5 MG PO TABS: 5.0000 mg | ORAL_TABLET | Freq: Every evening | ORAL | Status: DC | PRN

## 2013-12-16 MED ORDER — ONDANSETRON HCL 4 MG PO TABS: 4.0000 mg | ORAL_TABLET | ORAL | Status: DC | PRN

## 2013-12-16 MED ORDER — PRENATAL MULTIVITAMIN CH
1.0000 | ORAL_TABLET | Freq: Every day | ORAL | Status: DC
  Administered 2013-12-16 – 2013-12-18 (×3): 1 via ORAL
  Filled 2013-12-16 (×3): qty 1

## 2013-12-16 MED ORDER — ONDANSETRON HCL 4 MG/2ML IJ SOLN: 4.0000 mg | INTRAMUSCULAR | Status: DC | PRN

## 2013-12-16 MED ORDER — IBUPROFEN 600 MG PO TABS
600.0000 mg | ORAL_TABLET | Freq: Four times a day (QID) | ORAL | Status: DC
  Administered 2013-12-16 – 2013-12-18 (×9): 600 mg via ORAL
  Filled 2013-12-16 (×9): qty 1

## 2013-12-16 MED ORDER — BENZOCAINE-MENTHOL 20-0.5 % EX AERO: 1.0000 "application " | INHALATION_SPRAY | CUTANEOUS | Status: DC | PRN

## 2013-12-16 MED ORDER — LANOLIN HYDROUS EX OINT: TOPICAL_OINTMENT | CUTANEOUS | Status: DC | PRN

## 2013-12-16 MED ORDER — SIMETHICONE 80 MG PO CHEW: 80.0000 mg | CHEWABLE_TABLET | ORAL | Status: DC | PRN

## 2013-12-16 MED ORDER — TETANUS-DIPHTH-ACELL PERTUSSIS 5-2.5-18.5 LF-MCG/0.5 IM SUSP: 0.5000 mL | Freq: Once | INTRAMUSCULAR | Status: DC

## 2013-12-16 MED ORDER — SENNOSIDES-DOCUSATE SODIUM 8.6-50 MG PO TABS
2.0000 | ORAL_TABLET | ORAL | Status: DC
  Administered 2013-12-17 (×2): 2 via ORAL
  Filled 2013-12-16 (×2): qty 2

## 2013-12-16 MED ORDER — WITCH HAZEL-GLYCERIN EX PADS: 1.0000 "application " | MEDICATED_PAD | CUTANEOUS | Status: DC | PRN

## 2013-12-16 NOTE — Anesthesia Postprocedure Evaluation (Signed)
  Anesthesia Post-op Note  Patient: Kelly Collier  Procedure(s) Performed: * No procedures listed *  Patient Location: Mother/Baby  Anesthesia Type:Epidural  Level of Consciousness: awake  Airway and Oxygen Therapy: Patient Spontanous Breathing  Post-op Pain: none  Post-op Assessment: Patient's Cardiovascular Status Stable, Respiratory Function Stable, Patent Airway, No signs of Nausea or vomiting, Adequate PO intake, Pain level controlled, No headache, No backache, No residual numbness and No residual motor weakness  Post-op Vital Signs: Reviewed and stable  Complications: No apparent anesthesia complications

## 2013-12-16 NOTE — Progress Notes (Signed)
Kelly Collier is a 23 y.o. G1P0 at 5439w1d by LMP confirmed by US admitted for induction of labor due to Post dates.  Subjective:  Not feeling a lot of pressure since she passed gas.  Feeling tired. Pushing with contractions, but not feeling contractions internally, can feel them more so externally with her hand.    Objective: BP 123/80  Pulse 130  Temp(Src) 99.6 F (37.6 C) (Oral)  Resp 18  Ht 5\' 1"  (1.549 m)  Wt 77.565 kg (171 lb)  BMI 32.33 kg/m2  SpO2 99%  LMP 03/03/2013   Total I/O In: -  Out: 900 [Urine:900]  FHT:  FHR: 140 bpm, variability: moderate,  accelerations:  Present,  decelerations:  Present few earlies UC:   irregular, every 2-4 minutes SVE:   Dilation: 10 Effacement (%): 100 Station: +2 Exam by:: Veronica Mensah  Labs: Lab Results  Component Value Date   WBC 15.1* 12/15/2013   HGB 11.8* 12/15/2013   HCT 34.4* 12/15/2013   MCV 93.2 12/15/2013   PLT 178 12/15/2013    Assessment / Plan: Induction of labor due to postterm. On pitocin.  Is moving baby down with pushes. Has been 4 hours in second stage of labor  Labor: consider increasing pit  Preeclampsia: Bp wnl  Fetal Wellbeing: Category I  Pain Control: Epidural  I/D: GBS pos- PCN  Anticipated MOD: NSVD    [name], Aaniyah Strohm L 12/16/2013, 2:51 AM

## 2013-12-16 NOTE — Progress Notes (Signed)
Kelly Collier is a 23 y.o. G1P0 at 3555w1d by LMP confirmed by US admitted for induction of labor due to Post dates.  Subjective:  Feeling significant discomfort and pressure.  Objective: BP 146/77  Pulse 126  Temp(Src) 99.4 F (37.4 C) (Oral)  Resp 18  Ht 5\' 1"  (1.549 m)  Wt 77.565 kg (171 lb)  BMI 32.33 kg/m2  SpO2 99%  LMP 03/03/2013   Total I/O In: -  Out: 900 [Urine:900]  FHT:  FHR: 150 bpm, variability: moderate,  accelerations:  Present,  decelerations:  Present [place] UC:   regular, every 1-2 minutes SVE:   Dilation: 10 Effacement (%): 100 Station: +2 Exam by:: Kelly Collier  Labs: Lab Results  Component Value Date   WBC 15.1* 12/15/2013   HGB 11.8* 12/15/2013   HCT 34.4* 12/15/2013   MCV 93.2 12/15/2013   PLT 178 12/15/2013    Assessment / Plan: Induction of labor due to postterm, progressing well on pitocin. Will start pushing.  Labor: Progressing well on pit. Borderline tachysystole. Turn pitocin from 10 to 8. Preeclampsia: Bp wnl  Fetal Wellbeing: Category I  Pain Control: Epidural  I/D: GBS pos- PCN  Anticipated MOD: NSVD  Kelly Collier, Kelly Collier 12/16/2013, 12:41 AM

## 2013-12-16 NOTE — Progress Notes (Signed)
Ur chart review completed.  

## 2013-12-17 ENCOUNTER — Encounter (HOSPITAL_COMMUNITY): Payer: Self-pay

## 2013-12-17 DIAGNOSIS — O09299 Supervision of pregnancy with other poor reproductive or obstetric history, unspecified trimester: Secondary | ICD-10-CM

## 2013-12-17 NOTE — Discharge Summary (Signed)
Obstetric Discharge Summary Reason for Admission: induction of labor for post dates Prenatal Procedures: none Intrapartum Procedures: spontaneous vaginal delivery complicated by shoulder dystocia Postpartum Procedures: none Complications-Operative and Postpartum: 1st degree perineal laceration Hemoglobin  Date Value Range Status  12/15/2013 11.8* 12.0 - 15.0 g/dL Final     HCT  Date Value Range Status  12/15/2013 34.4* 36.0 - 46.0 % Final    Physical Exam:  General: alert, cooperative, appears stated age, no distress and Tired Lochia: appropriate Uterine Fundus: firm DVT Evaluation: No evidence of DVT seen on physical exam. Negative Homan's sign. No cords or calf tenderness. No significant calf/ankle edema.  Discharge Diagnoses: Term Pregnancy-delivered and Post-date pregnancy  Discharge Information: Date: 12/17/2013 Activity: pelvic rest Diet: routine Medications: PNV Condition: stable Instructions: refer to practice specific booklet Discharge to: home Follow-up Information   [facility] Schedule an appointment as soon as possible for a visit in 3 weeks.   Specialty:  Obstetrics and Gynecology   Contact information:   [address] [place] [id number] [phone]      Hospital Course Induction of labor for postdates. Prolonged active labor. SVD complicated by shoulder dystocia. 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   Ms. Kelly Collier is breast feeding and does not desire contraception. Stable condition. Physical exam of infant shows grasping of hand, spontaneous movement, +moro. Optimistic for prognosis.  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  Newborn Data: Live born female  Birth Weight: 8 lb 11 oz (3941 g) APGAR: 7, 8  Home with mother.  [name], Patricia L 12/17/2013, 7:51 AM

## 2013-12-17 NOTE — Discharge Summary (Signed)
Attestation of Attending Supervision of Fellow: Evaluation and management procedures were performed by the Fellow under my supervision and collaboration.  I have reviewed the Fellow's note and chart, and I agree with the management and plan.    

## 2013-12-17 NOTE — Discharge Instructions (Signed)
Postpartum Care After Vaginal Delivery °After you deliver your newborn (postpartum period), the usual stay in the hospital is 24 72 hours. If there were problems with your labor or delivery, or if you have other medical problems, you might be in the hospital longer.  °While you are in the hospital, you will receive help and instructions on how to care for yourself and your newborn during the postpartum period.  °While you are in the hospital: °· Be sure to tell your nurses if you have pain or discomfort, as well as where you feel the pain and what makes the pain worse. °· If you had an incision made near your vagina (episiotomy) or if you had some tearing during delivery, the nurses may put ice packs on your episiotomy or tear. The ice packs may help to reduce the pain and swelling. °· If you are breastfeeding, you may feel uncomfortable contractions of your uterus for a couple of weeks. This is normal. The contractions help your uterus get back to normal size. °· It is normal to have some bleeding after delivery. °· For the first 1 3 days after delivery, the flow is red and the amount may be similar to a period. °· It is common for the flow to start and stop. °· In the first few days, you may pass some small clots. Let your nurses know if you begin to pass large clots or your flow increases. °· Do not  flush blood clots down the toilet before having the nurse look at them. °· During the next 3 10 days after delivery, your flow should become more watery and pink or brown-tinged in color. °· Ten to fourteen days after delivery, your flow should be a small amount of yellowish-white discharge. °· The amount of your flow will decrease over the first few weeks after delivery. Your flow may stop in 6 8 weeks. Most women have had their flow stop by 12 weeks after delivery. °· You should change your sanitary pads frequently. °· Wash your hands thoroughly with soap and water for at least 20 seconds after changing pads, using  the toilet, or before holding or feeding your newborn. °· You should feel like you need to empty your bladder within the first 6 8 hours after delivery. °· In case you become weak, lightheaded, or faint, call your nurse before you get out of bed for the first time and before you take a shower for the first time. °· Within the first few days after delivery, your breasts may begin to feel tender and full. This is called engorgement. Breast tenderness usually goes away within 48 72 hours after engorgement occurs. You may also notice milk leaking from your breasts. If you are not breastfeeding, do not stimulate your breasts. Breast stimulation can make your breasts produce more milk. °· Spending as much time as possible with your newborn is very important. During this time, you and your newborn can feel close and get to know each other. Having your newborn stay in your room (rooming in) will help to strengthen the bond with your newborn.  It will give you time to get to know your newborn and become comfortable caring for your newborn. °· Your hormones change after delivery. Sometimes the hormone changes can temporarily cause you to feel sad or tearful. These feelings should not last more than a few days. If these feelings last longer than that, you should talk to your caregiver. °· If desired, talk to your caregiver about   methods of family planning or contraception.  Talk to your caregiver about immunizations. Your caregiver may want you to have the following immunizations before leaving the hospital:  Tetanus, diphtheria, and pertussis (Tdap) or tetanus and diphtheria (Td) immunization. It is very important that you and your family (including grandparents) or others caring for your newborn are up-to-date with the Tdap or Td immunizations. The Tdap or Td immunization can help protect your newborn from getting ill.  Rubella immunization.  Varicella (chickenpox) immunization.  Influenza immunization. You should  receive this annual immunization if you did not receive the immunization during your pregnancy. Document Released: 08/21/2007 Document Revised: 07/18/2012 Document Reviewed: 06/20/2012 [facility] Patient Information 2014 [place].  Postpartum Depression and Baby Blues The postpartum period begins right after the birth of a baby. During this time, there is often a great amount of joy and excitement. It is also a time of considerable changes in the life of the parent(s). Regardless of how many times a mother gives birth, each child brings new challenges and dynamics to the family. It is not unusual to have feelings of excitement accompanied by confusing shifts in moods, emotions, and thoughts. All mothers are at risk of developing postpartum depression or the "baby blues." These mood changes can occur right after giving birth, or they may occur many months after giving birth. The baby blues or postpartum depression can be mild or severe. Additionally, postpartum depression can resolve rather quickly, or it can be a long-term condition. CAUSES Elevated hormones and their rapid decline are thought to be a main cause of postpartum depression and the baby blues. There are a number of hormones that radically change during and after pregnancy. Estrogen and progesterone usually decrease immediately after delivering your baby. The level of thyroid hormone and various cortisol steroids also rapidly drop. Other factors that play a major role in these changes include major life events and genetics.  RISK FACTORS If you have any of the following risks for the baby blues or postpartum depression, know what symptoms to watch out for during the postpartum period. Risk factors that may increase the likelihood of getting the baby blues or postpartum depression include:  Havinga personal or family history of depression.  Having depression while being pregnant.  Having premenstrual or oral contraceptive-associated  mood issues.  Having exceptional life stress.  Having marital conflict.  Lacking a social support network.  Having a baby with special needs.  Having health problems such as diabetes. SYMPTOMS Baby blues symptoms include:  Brief fluctuations in mood, such as going from extreme happiness to sadness.  Decreased concentration.  Difficulty sleeping.  Crying spells, tearfulness.  Irritability.  Anxiety. Postpartum depression symptoms typically begin within the first month after giving birth. These symptoms include:  Difficulty sleeping or excessive sleepiness.  Marked weight loss.  Agitation.  Feelings of worthlessness.  Lack of interest in activity or food. Postpartum psychosis is a very concerning condition and can be dangerous. Fortunately, it is rare. Displaying any of the following symptoms is cause for immediate medical attention. Postpartum psychosis symptoms include:  Hallucinations and delusions.  Bizarre or disorganized behavior.  Confusion or disorientation. DIAGNOSIS  A diagnosis is made by an evaluation of your symptoms. There are no medical or lab tests that lead to a diagnosis, but there are various questionnaires that a caregiver may use to identify those with the baby blues, postpartum depression, or psychosis. Often times, a screening tool called the [place] Postnatal Depression Scale is used to diagnose  depression in the postpartum period.  °TREATMENT °The baby blues usually goes away on its own in 1 to 2 weeks. Social support is often all that is needed. You should be encouraged to get adequate sleep and rest. Occasionally, you may be given medicines to help you sleep.  °Postpartum depression requires treatment as it can last several months or longer if it is not treated. Treatment may include individual or group therapy, medicine, or both to address any social, physiological, and psychological factors that may play a role in the depression. Regular  exercise, a healthy diet, rest, and social support may also be strongly recommended.  °Postpartum psychosis is more serious and needs treatment right away. Hospitalization is often needed. °HOME CARE INSTRUCTIONS °· Get as much rest as you can. Nap when the baby sleeps. °· Exercise regularly. Some women find yoga and walking to be beneficial. °· Eat a balanced and nourishing diet. °· Do little things that you enjoy. Have a cup of tea, take a bubble bath, read your favorite magazine, or listen to your favorite music. °· Avoid alcohol. °· Ask for help with household chores, cooking, grocery shopping, or running errands as needed. Do not try to do everything. °· Talk to people close to you about how you are feeling. Get support from your partner, family members, friends, or other new moms. °· Try to stay positive in how you think. Think about the things you are grateful for. °· Do not spend a lot of time alone. °· Only take medicine as directed by your caregiver. °· Keep all your postpartum appointments. °· Let your caregiver know if you have any concerns. °SEEK MEDICAL CARE IF: °You are having a reaction or problems with your medicine. °SEEK IMMEDIATE MEDICAL CARE IF: °· You have suicidal feelings. °· You feel you may harm the baby or someone else. °Document Released: 07/28/2004 Document Revised: 01/16/2012 Document Reviewed: 08/05/2013 °[employer]® Patient Information ©2014 [employer]. ° °

## 2013-12-18 NOTE — Lactation Note (Addendum)
This note was copied from the chart of Kelly Nyra MarketKarissa Molloy. Lactation Consultation Note: follow up visit with mom before DC. She reports that baby has been nursing well and reports tugging, no pain with nursing. Baby asleep at mom's side. No questions at present. To call prn  Patient Name: Kelly Collier [id number]'s Date: 12/18/2013 Reason for consult: Follow-up assessment   Maternal Data    Feeding    LATCH Score/Interventions          Comfort (Breast/Nipple): Soft / non-tender           Lactation Tools Discussed/Used     Consult Status Consult Status: Complete    Kelly Collier, Kelly Collier 12/18/2013, 7:53 AM

## 2013-12-20 ENCOUNTER — Encounter: Payer: Self-pay | Admitting: *Deleted

## 2014-01-08 ENCOUNTER — Ambulatory Visit (INDEPENDENT_AMBULATORY_CARE_PROVIDER_SITE_OTHER): Payer: Medicaid Other | Admitting: Obstetrics & Gynecology

## 2014-01-08 ENCOUNTER — Encounter: Payer: Self-pay | Admitting: Obstetrics & Gynecology

## 2014-01-08 NOTE — Progress Notes (Signed)
   Subjective:    Patient ID: Kelly Collier, female    DOB: 1991/03/07, 23 y.o.   MRN: [id number]  HPI  23 yo P1 here for her 3 week pp visit. She delivered her son via NSVD but had a should dystocia. She and the baby are doing well. She is bottle feeding. She wants to use condoms for contraception. She denies depression or other problems. The baby is growing well.  Review of Systems     Objective:   Physical Exam  Normal vulva/vagina Well-involuted uterus.      Assessment & Plan:  Preventative- repeat pap in 6 months Contraception- condoms

## 2014-01-27 ENCOUNTER — Ambulatory Visit: Payer: Medicaid Other | Admitting: Obstetrics and Gynecology

## 2014-09-08 ENCOUNTER — Encounter: Payer: Self-pay | Admitting: Obstetrics & Gynecology

## 2019-05-31 DIAGNOSIS — R300 Dysuria: Secondary | ICD-10-CM | POA: Diagnosis not present

## 2019-05-31 DIAGNOSIS — Z118 Encounter for screening for other infectious and parasitic diseases: Secondary | ICD-10-CM | POA: Diagnosis not present

## 2019-05-31 DIAGNOSIS — B373 Candidiasis of vulva and vagina: Secondary | ICD-10-CM | POA: Diagnosis not present

## 2019-06-14 DIAGNOSIS — Z01419 Encounter for gynecological examination (general) (routine) without abnormal findings: Secondary | ICD-10-CM | POA: Diagnosis not present

## 2019-06-14 DIAGNOSIS — Z6821 Body mass index (BMI) 21.0-21.9, adult: Secondary | ICD-10-CM | POA: Diagnosis not present

## 2019-06-14 DIAGNOSIS — N760 Acute vaginitis: Secondary | ICD-10-CM | POA: Diagnosis not present

## 2020-04-10 DIAGNOSIS — G243 Spasmodic torticollis: Secondary | ICD-10-CM | POA: Diagnosis not present

## 2020-05-04 DIAGNOSIS — Z3200 Encounter for pregnancy test, result unknown: Secondary | ICD-10-CM | POA: Diagnosis not present

## 2020-05-04 DIAGNOSIS — Z3689 Encounter for other specified antenatal screening: Secondary | ICD-10-CM | POA: Diagnosis not present

## 2020-05-07 DIAGNOSIS — N390 Urinary tract infection, site not specified: Secondary | ICD-10-CM | POA: Diagnosis not present

## 2020-05-07 DIAGNOSIS — R82998 Other abnormal findings in urine: Secondary | ICD-10-CM | POA: Diagnosis not present

## 2020-05-07 DIAGNOSIS — O209 Hemorrhage in early pregnancy, unspecified: Secondary | ICD-10-CM | POA: Diagnosis not present

## 2020-05-07 DIAGNOSIS — Z118 Encounter for screening for other infectious and parasitic diseases: Secondary | ICD-10-CM | POA: Diagnosis not present

## 2020-05-07 DIAGNOSIS — N760 Acute vaginitis: Secondary | ICD-10-CM | POA: Diagnosis not present

## 2020-05-11 DIAGNOSIS — F411 Generalized anxiety disorder: Secondary | ICD-10-CM | POA: Diagnosis not present

## 2020-05-13 DIAGNOSIS — Z3201 Encounter for pregnancy test, result positive: Secondary | ICD-10-CM | POA: Diagnosis not present

## 2020-05-20 DIAGNOSIS — Z3A09 9 weeks gestation of pregnancy: Secondary | ICD-10-CM | POA: Diagnosis not present

## 2020-05-20 DIAGNOSIS — O210 Mild hyperemesis gravidarum: Secondary | ICD-10-CM | POA: Diagnosis not present

## 2020-05-28 DIAGNOSIS — Z3481 Encounter for supervision of other normal pregnancy, first trimester: Secondary | ICD-10-CM | POA: Diagnosis not present

## 2020-05-28 DIAGNOSIS — Z01419 Encounter for gynecological examination (general) (routine) without abnormal findings: Secondary | ICD-10-CM | POA: Diagnosis not present

## 2020-05-28 DIAGNOSIS — Z118 Encounter for screening for other infectious and parasitic diseases: Secondary | ICD-10-CM | POA: Diagnosis not present

## 2020-05-28 DIAGNOSIS — Z1151 Encounter for screening for human papillomavirus (HPV): Secondary | ICD-10-CM | POA: Diagnosis not present

## 2020-05-28 LAB — OB RESULTS CONSOLE GC/CHLAMYDIA
Chlamydia: NEGATIVE
Gonorrhea: NEGATIVE

## 2020-05-28 LAB — OB RESULTS CONSOLE RUBELLA ANTIBODY, IGM: Rubella: IMMUNE

## 2020-05-28 LAB — OB RESULTS CONSOLE RPR: RPR: NONREACTIVE

## 2020-05-28 LAB — OB RESULTS CONSOLE HIV ANTIBODY (ROUTINE TESTING): HIV: NONREACTIVE

## 2020-05-28 LAB — OB RESULTS CONSOLE HEPATITIS B SURFACE ANTIGEN: Hepatitis B Surface Ag: NEGATIVE

## 2020-06-16 DIAGNOSIS — Z3481 Encounter for supervision of other normal pregnancy, first trimester: Secondary | ICD-10-CM | POA: Diagnosis not present

## 2020-06-16 DIAGNOSIS — Z3682 Encounter for antenatal screening for nuchal translucency: Secondary | ICD-10-CM | POA: Diagnosis not present

## 2020-06-30 DIAGNOSIS — Z20822 Contact with and (suspected) exposure to covid-19: Secondary | ICD-10-CM | POA: Diagnosis not present

## 2020-07-06 DIAGNOSIS — Z3402 Encounter for supervision of normal first pregnancy, second trimester: Secondary | ICD-10-CM | POA: Diagnosis not present

## 2020-07-14 DIAGNOSIS — Z361 Encounter for antenatal screening for raised alphafetoprotein level: Secondary | ICD-10-CM | POA: Diagnosis not present

## 2020-07-27 DIAGNOSIS — Z3402 Encounter for supervision of normal first pregnancy, second trimester: Secondary | ICD-10-CM | POA: Diagnosis not present

## 2020-07-27 DIAGNOSIS — Z363 Encounter for antenatal screening for malformations: Secondary | ICD-10-CM | POA: Diagnosis not present

## 2020-08-26 DIAGNOSIS — O3662X0 Maternal care for excessive fetal growth, second trimester, not applicable or unspecified: Secondary | ICD-10-CM | POA: Diagnosis not present

## 2020-08-26 DIAGNOSIS — Z3A23 23 weeks gestation of pregnancy: Secondary | ICD-10-CM | POA: Diagnosis not present

## 2020-09-21 DIAGNOSIS — O3662X0 Maternal care for excessive fetal growth, second trimester, not applicable or unspecified: Secondary | ICD-10-CM | POA: Diagnosis not present

## 2020-09-21 DIAGNOSIS — Z3689 Encounter for other specified antenatal screening: Secondary | ICD-10-CM | POA: Diagnosis not present

## 2020-09-21 DIAGNOSIS — Z3A27 27 weeks gestation of pregnancy: Secondary | ICD-10-CM | POA: Diagnosis not present

## 2020-10-06 DIAGNOSIS — O2603 Excessive weight gain in pregnancy, third trimester: Secondary | ICD-10-CM | POA: Diagnosis not present

## 2020-10-06 DIAGNOSIS — Z3A29 29 weeks gestation of pregnancy: Secondary | ICD-10-CM | POA: Diagnosis not present

## 2020-10-06 DIAGNOSIS — Z3689 Encounter for other specified antenatal screening: Secondary | ICD-10-CM | POA: Diagnosis not present

## 2020-10-06 DIAGNOSIS — Z23 Encounter for immunization: Secondary | ICD-10-CM | POA: Diagnosis not present

## 2020-11-07 NOTE — L&D Delivery Note (Signed)
Delivery Note Complete at 12 midnight, allowed passive descent due to [facility] and prior shoulder dystocia hx. At 3.45 am pushing began at station +2.  At 4:58 AM a viable and healthy female was delivered via Vaginal, Spontaneous (Presentation: Right Occiput Anterior).  APGAR: 8, 9; weight  -pending. Shoulders followed head easily after head restituted. .   Placenta status: Spontaneous, Intact.  Cord:   with the following complications: None.  Cord pH: NA  Anesthesia: Epidural Episiotomy: None Lacerations: None  Est. Blood Loss (mL): 400  Mom to postpartum.  Baby to Couplet care / Skin to Skin.  Kelly Collier 12/14/2020, 5:24 AM

## 2020-11-18 LAB — OB RESULTS CONSOLE GBS: GBS: NEGATIVE

## 2020-11-20 ENCOUNTER — Telehealth (HOSPITAL_COMMUNITY): Payer: Self-pay | Admitting: *Deleted

## 2020-11-20 ENCOUNTER — Encounter (HOSPITAL_COMMUNITY): Payer: Self-pay | Admitting: *Deleted

## 2020-11-20 NOTE — Telephone Encounter (Signed)
Preadmission screen  

## 2020-11-23 ENCOUNTER — Telehealth (HOSPITAL_COMMUNITY): Payer: Self-pay | Admitting: *Deleted

## 2020-11-23 NOTE — Telephone Encounter (Signed)
Preadmission screen  

## 2020-11-24 ENCOUNTER — Telehealth (HOSPITAL_COMMUNITY): Payer: Self-pay | Admitting: *Deleted

## 2020-11-24 ENCOUNTER — Encounter (HOSPITAL_COMMUNITY): Payer: Self-pay | Admitting: *Deleted

## 2020-11-24 NOTE — Telephone Encounter (Signed)
Preadmission screen  

## 2020-11-25 ENCOUNTER — Other Ambulatory Visit (HOSPITAL_COMMUNITY)
Admission: RE | Admit: 2020-11-25 | Discharge: 2020-11-25 | Disposition: A | Discharge status: Home / Self Care | Payer: Federal, State, Local not specified - PPO | Source: Ambulatory Visit | Attending: Obstetrics & Gynecology | Admitting: Obstetrics & Gynecology

## 2020-11-25 DIAGNOSIS — U071 COVID-19: Secondary | ICD-10-CM | POA: Diagnosis not present

## 2020-11-25 DIAGNOSIS — Z01818 Encounter for other preprocedural examination: Secondary | ICD-10-CM | POA: Insufficient documentation

## 2020-11-25 LAB — SARS CORONAVIRUS 2 (TAT 6-24 HRS): SARS Coronavirus 2: POSITIVE — AB

## 2020-11-26 NOTE — Progress Notes (Signed)
Patient tested positive for COVID on 11/25/20, Ib message sent to Dr Juliene Pina.

## 2020-11-27 ENCOUNTER — Observation Stay (HOSPITAL_COMMUNITY): Admission: RE | Admit: 2020-11-27 | Payer: Federal, State, Local not specified - PPO | Source: Ambulatory Visit

## 2020-12-04 ENCOUNTER — Other Ambulatory Visit: Payer: Self-pay | Admitting: Obstetrics & Gynecology

## 2020-12-08 ENCOUNTER — Telehealth (HOSPITAL_COMMUNITY): Payer: Self-pay | Admitting: *Deleted

## 2020-12-08 NOTE — Telephone Encounter (Signed)
Preadmission screen  

## 2020-12-09 ENCOUNTER — Telehealth (HOSPITAL_COMMUNITY): Payer: Self-pay | Admitting: *Deleted

## 2020-12-09 NOTE — Telephone Encounter (Signed)
Preadmission screen  

## 2020-12-10 ENCOUNTER — Telehealth (HOSPITAL_COMMUNITY): Payer: Self-pay | Admitting: *Deleted

## 2020-12-10 ENCOUNTER — Encounter (HOSPITAL_COMMUNITY): Payer: Self-pay | Admitting: *Deleted

## 2020-12-10 NOTE — Telephone Encounter (Signed)
Preadmission screen  

## 2020-12-11 ENCOUNTER — Other Ambulatory Visit (HOSPITAL_COMMUNITY): Payer: Self-pay

## 2020-12-13 ENCOUNTER — Encounter (HOSPITAL_COMMUNITY): Payer: Self-pay | Admitting: Obstetrics & Gynecology

## 2020-12-13 ENCOUNTER — Inpatient Hospital Stay (HOSPITAL_COMMUNITY)
Admission: AD | Admit: 2020-12-13 | Discharge: 2020-12-16 | DRG: 806 | Disposition: A | Discharge status: Home / Self Care | Payer: Federal, State, Local not specified - PPO | Attending: Obstetrics & Gynecology | Admitting: Obstetrics & Gynecology

## 2020-12-13 ENCOUNTER — Inpatient Hospital Stay (HOSPITAL_COMMUNITY): Payer: Federal, State, Local not specified - PPO | Admitting: Anesthesiology

## 2020-12-13 ENCOUNTER — Inpatient Hospital Stay (HOSPITAL_COMMUNITY): Payer: Federal, State, Local not specified - PPO

## 2020-12-13 DIAGNOSIS — O9902 Anemia complicating childbirth: Secondary | ICD-10-CM | POA: Diagnosis present

## 2020-12-13 DIAGNOSIS — O9081 Anemia of the puerperium: Secondary | ICD-10-CM | POA: Diagnosis not present

## 2020-12-13 DIAGNOSIS — O3663X0 Maternal care for excessive fetal growth, third trimester, not applicable or unspecified: Principal | ICD-10-CM | POA: Diagnosis present

## 2020-12-13 DIAGNOSIS — D62 Acute posthemorrhagic anemia: Secondary | ICD-10-CM | POA: Diagnosis not present

## 2020-12-13 DIAGNOSIS — Z3490 Encounter for supervision of normal pregnancy, unspecified, unspecified trimester: Secondary | ICD-10-CM | POA: Diagnosis present

## 2020-12-13 DIAGNOSIS — Z3A39 39 weeks gestation of pregnancy: Secondary | ICD-10-CM

## 2020-12-13 LAB — TYPE AND SCREEN
ABO/RH(D): O POS
Antibody Screen: NEGATIVE

## 2020-12-13 LAB — CBC
HCT: 34.5 % — ABNORMAL LOW (ref 36.0–46.0)
Hemoglobin: 11.5 g/dL — ABNORMAL LOW (ref 12.0–15.0)
MCH: 31 pg (ref 26.0–34.0)
MCHC: 33.3 g/dL (ref 30.0–36.0)
MCV: 93 fL (ref 80.0–100.0)
Platelets: 180 10*3/uL (ref 150–400)
RBC: 3.71 MIL/uL — ABNORMAL LOW (ref 3.87–5.11)
RDW: 12.6 % (ref 11.5–15.5)
WBC: 13.1 10*3/uL — ABNORMAL HIGH (ref 4.0–10.5)
nRBC: 0 % (ref 0.0–0.2)

## 2020-12-13 MED ORDER — EPHEDRINE 5 MG/ML INJ: 10.0000 mg | INTRAVENOUS | Status: DC | PRN

## 2020-12-13 MED ORDER — LACTATED RINGERS IV SOLN: INTRAVENOUS | Status: DC

## 2020-12-13 MED ORDER — TERBUTALINE SULFATE 1 MG/ML IJ SOLN: 0.2500 mg | Freq: Once | INTRAMUSCULAR | Status: DC | PRN

## 2020-12-13 MED ORDER — PHENYLEPHRINE 40 MCG/ML (10ML) SYRINGE FOR IV PUSH (FOR BLOOD PRESSURE SUPPORT): 80.0000 ug | PREFILLED_SYRINGE | INTRAVENOUS | Status: DC | PRN

## 2020-12-13 MED ORDER — LACTATED RINGERS IV SOLN: 500.0000 mL | Freq: Once | INTRAVENOUS | Status: DC

## 2020-12-13 MED ORDER — LIDOCAINE-EPINEPHRINE (PF) 2 %-1:200000 IJ SOLN
INTRAMUSCULAR | Status: DC | PRN
  Administered 2020-12-13: 5 mL via EPIDURAL

## 2020-12-13 MED ORDER — ONDANSETRON HCL 4 MG/2ML IJ SOLN
4.0000 mg | Freq: Four times a day (QID) | INTRAMUSCULAR | Status: DC | PRN
  Administered 2020-12-14: 4 mg via INTRAVENOUS
  Filled 2020-12-13: qty 2

## 2020-12-13 MED ORDER — LIDOCAINE HCL (PF) 1 % IJ SOLN: 30.0000 mL | INTRAMUSCULAR | Status: DC | PRN

## 2020-12-13 MED ORDER — OXYTOCIN BOLUS FROM INFUSION
333.0000 mL | Freq: Once | INTRAVENOUS | Status: AC
  Administered 2020-12-14: 333 mL via INTRAVENOUS

## 2020-12-13 MED ORDER — LACTATED RINGERS IV SOLN
500.0000 mL | INTRAVENOUS | Status: DC | PRN
  Administered 2020-12-13: 1000 mL via INTRAVENOUS

## 2020-12-13 MED ORDER — OXYTOCIN-SODIUM CHLORIDE 30-0.9 UT/500ML-% IV SOLN
1.0000 m[IU]/min | INTRAVENOUS | Status: DC
  Administered 2020-12-13: 2 m[IU]/min via INTRAVENOUS
  Filled 2020-12-13: qty 500

## 2020-12-13 MED ORDER — FENTANYL-BUPIVACAINE-NACL 0.5-0.125-0.9 MG/250ML-% EP SOLN
12.0000 mL/h | EPIDURAL | Status: DC | PRN
  Administered 2020-12-13: 12 mL/h via EPIDURAL
  Filled 2020-12-13: qty 250

## 2020-12-13 MED ORDER — OXYTOCIN-SODIUM CHLORIDE 30-0.9 UT/500ML-% IV SOLN
2.5000 [IU]/h | INTRAVENOUS | Status: DC
  Administered 2020-12-14: 2.5 [IU]/h via INTRAVENOUS

## 2020-12-13 MED ORDER — DIPHENHYDRAMINE HCL 50 MG/ML IJ SOLN: 12.5000 mg | INTRAMUSCULAR | Status: DC | PRN

## 2020-12-13 MED ORDER — ACETAMINOPHEN 325 MG PO TABS: 650.0000 mg | ORAL_TABLET | ORAL | Status: DC | PRN

## 2020-12-13 MED ORDER — SOD CITRATE-CITRIC ACID 500-334 MG/5ML PO SOLN: 30.0000 mL | ORAL | Status: DC | PRN

## 2020-12-13 NOTE — Progress Notes (Signed)
IOL for LGA. 39 wks, prior 8'11" at 41 wks with shoulder dytocia.  AROM at 11.40 AM Fetus feels oblique with head above the inlet. Exaggerated Sims in left lateral to aid rotation and descent.   V.Therasa Lorenzi MD

## 2020-12-13 NOTE — Progress Notes (Signed)
G3P1011, IOL for LGA at 39 wks, prior 8'11" at 41 wks with shoulder dytocia. GBS(-)  Pitocin since admission. AROM at 11.40 AM, S/p epidural resting, turning with exaggerated Sims and then Peanut ball.  SVE 9 cm, cervix on left side, -2/ Vx/ LOT FHT 150s + accels no decels mod variab- cat I   Turned to Right lateral with Peanut and sit upright once complete to allow descent.  EFW 9-lbs.  Shoulder dystocia recurrence risk, pt counseled and RN/ room prepared.   V.Skilynn Durney MD

## 2020-12-13 NOTE — Anesthesia Preprocedure Evaluation (Signed)
Anesthesia Evaluation  Patient identified by MRN, date of birth, ID band Patient awake    Reviewed: Allergy & Precautions, NPO status , Patient's Chart, lab work & pertinent test results  Airway Mallampati: II  TM Distance: >3 FB Neck ROM: Full    Dental no notable dental hx.    Pulmonary neg pulmonary ROS,    Pulmonary exam normal breath sounds clear to auscultation       Cardiovascular negative cardio ROS Normal cardiovascular exam Rhythm:Regular Rate:Normal     Neuro/Psych negative neurological ROS  negative psych ROS   GI/Hepatic negative GI ROS, Neg liver ROS,   Endo/Other  negative endocrine ROS  Renal/GU negative Renal ROS  negative genitourinary   Musculoskeletal negative musculoskeletal ROS (+)   Abdominal   Peds  Hematology negative hematology ROS (+)   Anesthesia Other Findings IOL for LGA  Reproductive/Obstetrics (+) Pregnancy                             Anesthesia Physical Anesthesia Plan  ASA: II  Anesthesia Plan: Spinal   Post-op Pain Management:    Induction:   PONV Risk Score and Plan: Treatment may vary due to age or medical condition  Airway Management Planned: Natural Airway  Additional Equipment:   Intra-op Plan:   Post-operative Plan:   Informed Consent: I have reviewed the patients History and Physical, chart, labs and discussed the procedure including the risks, benefits and alternatives for the proposed anesthesia with the patient or authorized representative who has indicated his/her understanding and acceptance.     Dental advisory given  Plan Discussed with: CRNA  Anesthesia Plan Comments:         Anesthesia Quick Evaluation

## 2020-12-13 NOTE — Anesthesia Procedure Notes (Signed)
Epidural Patient location during procedure: OB Start time: 12/13/2020 7:55 PM End time: 12/13/2020 8:05 PM  Staffing Anesthesiologist: Elmer Picker, MD Performed: anesthesiologist   Preanesthetic Checklist Completed: patient identified, IV checked, risks and benefits discussed, monitors and equipment checked, pre-op evaluation and timeout performed  Epidural Patient position: sitting Prep: DuraPrep and site prepped and draped Patient monitoring: continuous pulse ox, blood pressure, heart rate and cardiac monitor Approach: midline Location: L3-L4 Injection technique: LOR air  Needle:  Needle type: Tuohy  Needle gauge: 17 G Needle length: 9 cm Needle insertion depth: 4 cm Catheter type: closed end flexible Catheter size: 19 Gauge Catheter at skin depth: 10 cm Test dose: negative  Assessment Sensory level: T8 Events: blood not aspirated, injection not painful, no injection resistance, no paresthesia and negative IV test  Additional Notes Patient identified. Risks/Benefits/Options discussed with patient including but not limited to bleeding, infection, nerve damage, paralysis, failed block, incomplete pain control, headache, blood pressure changes, nausea, vomiting, reactions to medication both or allergic, itching and postpartum back pain. Confirmed with bedside nurse the patient's most recent platelet count. Confirmed with patient that they are not currently taking any anticoagulation, have any bleeding history or any family history of bleeding disorders. Patient expressed understanding and wished to proceed. All questions were answered. Sterile technique was used throughout the entire procedure. Please see nursing notes for vital signs. Test dose was given through epidural catheter and negative prior to continuing to dose epidural or start infusion. Warning signs of high block given to the patient including shortness of breath, tingling/numbness in hands, complete motor block, or  any concerning symptoms with instructions to call for help. Patient was given instructions on fall risk and not to get out of bed. All questions and concerns addressed with instructions to call with any issues or inadequate analgesia.  Reason for block:procedure for pain

## 2020-12-13 NOTE — H&P (Addendum)
Kelly Collier is a 30 y.o. female G3P1011, prior term SVD 8'11" with shoulder dystocia, declines C-section this pregnancy despite LGA. She is presenting IOL at 39 wks LGA.  Baby spontaneously verted to cephalic at 36 wks, so version was cancelled, she was also Covid + with no symptoms, diagnosed at pre-op visit.  Last sono was at 35 wks- 7'4" 96%, AC 98%, AFI 24 cm.-No GDM.  risk of macrosomia, shoulder dystocia recurrence since this baby will be likely 9 lb d/w pt, she declined elective C-section.  One SAB before this pregnancy  OB History    Gravida  2   Para  1   Term  1   Preterm      AB      Living  1     SAB      IAB      Ectopic      Multiple      Live Births  1          Past Medical History:  Diagnosis Date  . Medical history non-contributory    Past Surgical History:  Procedure Laterality Date  . NO PAST SURGERIES     Family History: family history includes Diabetes in her father; Heart disease in her brother and father; Hypertension in her mother. Social History:  reports that she has never smoked. She has never used smokeless tobacco. She reports that she does not drink alcohol and does not use drugs.     Maternal Diabetes: No Genetic Screening: Normal Maternal Ultrasounds/Referrals: Normal Fetal Ultrasounds or other Referrals:  None Maternal Substance Abuse:  No Significant Maternal Medications:  None Significant Maternal Lab Results:  Group B Strep negative Other Comments:  None  Review of Systems History Dilation: 3 Effacement (%): 50 Station: -3 Exam by:: S Earl RNC Blood pressure 113/79, pulse (!) 103, temperature 98.2 F (36.8 C), temperature source Oral, resp. rate 15, height 5\' 1"  (1.549 m), weight 77.1 kg. Exam Physical Exam  Physical exam:  A&O x 3, no acute distress. Pleasant HEENT neg, no thyromegaly Lungs CTA bilat CV RRR, S1S2 normal Abdo soft, non tender, non acute Extr no edema/ tenderness Pelvic  3/50%/-5/VBallotable-- controlled AROM done, copious fluid, slight mec tinge, waited till head well applied, high station but then head applied well to cervic FHT  150s + accels no decels mod variab- cat I Toco q 2 min  Prenatal labs: ABO, Rh: --/--/PENDING (02/06 1005) Antibody: PENDING (02/06 1005) Rubella: Immune (07/22 0000) RPR: Nonreactive (07/22 0000)  HBsAg: Negative (07/22 0000)  HIV: Non-reactive (07/22 0000)  GBS:   Negative (11/18/2020)  Assessment/Plan: 30 yo G3P1011 at 39 wks IOL for LGA, with h/o shoulder dystocia with 1st kid- 8'11" at 41 wks, declined C-section.  Now EFW 8.1/2 - 9 lbs./  Controlled AROM done, pitocin per protocol  EPidural per pt  Working towards SVD, prepare room for shoulder dystocia  GBS(-)  --V.Gleb Mcguire MD   38 12/13/2020, 10:36 AM

## 2020-12-14 ENCOUNTER — Encounter (HOSPITAL_COMMUNITY): Payer: Self-pay | Admitting: Obstetrics & Gynecology

## 2020-12-14 LAB — RPR: RPR Ser Ql: NONREACTIVE

## 2020-12-14 MED ORDER — PRENATAL MULTIVITAMIN CH
1.0000 | ORAL_TABLET | Freq: Every day | ORAL | Status: DC
  Administered 2020-12-14 – 2020-12-15 (×2): 1 via ORAL
  Filled 2020-12-14 (×2): qty 1

## 2020-12-14 MED ORDER — ZOLPIDEM TARTRATE 5 MG PO TABS: 5.0000 mg | ORAL_TABLET | Freq: Every evening | ORAL | Status: DC | PRN

## 2020-12-14 MED ORDER — SIMETHICONE 80 MG PO CHEW: 80.0000 mg | CHEWABLE_TABLET | ORAL | Status: DC | PRN

## 2020-12-14 MED ORDER — BENZOCAINE-MENTHOL 20-0.5 % EX AERO: 1.0000 "application " | INHALATION_SPRAY | CUTANEOUS | Status: DC | PRN

## 2020-12-14 MED ORDER — DIPHENHYDRAMINE HCL 25 MG PO CAPS: 25.0000 mg | ORAL_CAPSULE | Freq: Four times a day (QID) | ORAL | Status: DC | PRN

## 2020-12-14 MED ORDER — DIBUCAINE (PERIANAL) 1 % EX OINT: 1.0000 "application " | TOPICAL_OINTMENT | CUTANEOUS | Status: DC | PRN

## 2020-12-14 MED ORDER — ACETAMINOPHEN 325 MG PO TABS: 650.0000 mg | ORAL_TABLET | ORAL | Status: DC | PRN

## 2020-12-14 MED ORDER — COCONUT OIL OIL: 1.0000 "application " | TOPICAL_OIL | Status: DC | PRN

## 2020-12-14 MED ORDER — TETANUS-DIPHTH-ACELL PERTUSSIS 5-2.5-18.5 LF-MCG/0.5 IM SUSY: 0.5000 mL | PREFILLED_SYRINGE | Freq: Once | INTRAMUSCULAR | Status: DC

## 2020-12-14 MED ORDER — IBUPROFEN 600 MG PO TABS
600.0000 mg | ORAL_TABLET | Freq: Four times a day (QID) | ORAL | Status: DC
  Administered 2020-12-14 – 2020-12-16 (×8): 600 mg via ORAL
  Filled 2020-12-14 (×8): qty 1

## 2020-12-14 MED ORDER — SENNOSIDES-DOCUSATE SODIUM 8.6-50 MG PO TABS
2.0000 | ORAL_TABLET | Freq: Every day | ORAL | Status: DC
  Administered 2020-12-15: 2 via ORAL
  Filled 2020-12-14: qty 2

## 2020-12-14 MED ORDER — ONDANSETRON HCL 4 MG PO TABS: 4.0000 mg | ORAL_TABLET | ORAL | Status: DC | PRN

## 2020-12-14 MED ORDER — WITCH HAZEL-GLYCERIN EX PADS: 1.0000 "application " | MEDICATED_PAD | CUTANEOUS | Status: DC | PRN

## 2020-12-14 MED ORDER — ONDANSETRON HCL 4 MG/2ML IJ SOLN: 4.0000 mg | INTRAMUSCULAR | Status: DC | PRN

## 2020-12-14 NOTE — Progress Notes (Signed)
G3P1011, IOL for LGA at 39.1 wks, prior 8'11" at 41 wks with shoulder dytocia. GBS(-)  Pitocin since admission. AROM at 11.40 AM, S/p epidural. BP 110/83   Pulse (!) 110   Temp 98.2 F (36.8 C) (Oral)   Resp 18   Ht 5\' 1"  (1.549 m)   Wt 77.1 kg   BMI 32.12 kg/m   FHT 150s + accels, mod variability, decels resolved overall cat I Toco - pitocin reduced to 6 since UCs adequate  SVE Complete and 0  Attempt pushing, reassess at 1 hr, C/s if not descent .  EFW 9-lbs.  Shoulder dystocia recurrence risk, pt counseled and RN/ room prepared.   V.Tiffane Sheldon MD

## 2020-12-14 NOTE — Progress Notes (Signed)
G3P1011, IOL for LGA at 39.1 wks, prior 8'11" at 41 wks with shoulder dytocia. GBS(-)  Pitocin since admission. AROM at 11.40 AM, S/p epidural resting, turning with exaggerated Sims and Peanut ball. BP 106/61   Pulse (!) 102   Temp 97.8 F (36.6 C) (Oral)   Resp 18   Ht 5\' 1"  (1.549 m)   Wt 77.1 kg   BMI 32.12 kg/m   FHT 150s + accels, mod variab, occasional prolonged variable decel with position change, overall cat I Toco - pitocin reduced from 22 to 16 and now to 14  SVE unchanged since complete at midnight, station still -1  Try high Fowlers and reassess in 1 hr if descent noted. If not discuss C/s vs attempt pushing.  EFW 9-lbs.  Shoulder dystocia recurrence risk, pt counseled and RN/ room prepared.   V.Derrich Gaby MD

## 2020-12-14 NOTE — Anesthesia Postprocedure Evaluation (Signed)
Anesthesia Post Note  Patient: Kelly Collier  Procedure(s) Performed: AN AD HOC LABOR EPIDURAL     Patient location during evaluation: Mother Baby Anesthesia Type: Spinal Level of consciousness: awake Pain management: satisfactory to patient Vital Signs Assessment: post-procedure vital signs reviewed and stable Respiratory status: spontaneous breathing Cardiovascular status: stable Anesthetic complications: no   No complications documented.  Last Vitals:  Vitals:   12/14/20 0807 12/14/20 1144  BP: 111/79 114/76  Pulse: 93 96  Resp: 17 16  Temp: 36.8 C 36.9 C  SpO2: 98% 98%    Last Pain:  Vitals:   12/14/20 1144  TempSrc: Oral  PainSc: 0-No pain   Pain Goal:                   [employer]

## 2020-12-15 DIAGNOSIS — O9902 Anemia complicating childbirth: Secondary | ICD-10-CM | POA: Diagnosis present

## 2020-12-15 LAB — CBC
HCT: 24.9 % — ABNORMAL LOW (ref 36.0–46.0)
Hemoglobin: 8 g/dL — ABNORMAL LOW (ref 12.0–15.0)
MCH: 30.3 pg (ref 26.0–34.0)
MCHC: 32.1 g/dL (ref 30.0–36.0)
MCV: 94.3 fL (ref 80.0–100.0)
Platelets: 140 10*3/uL — ABNORMAL LOW (ref 150–400)
RBC: 2.64 MIL/uL — ABNORMAL LOW (ref 3.87–5.11)
RDW: 12.9 % (ref 11.5–15.5)
WBC: 13.3 10*3/uL — ABNORMAL HIGH (ref 4.0–10.5)
nRBC: 0 % (ref 0.0–0.2)

## 2020-12-15 MED ORDER — POLYSACCHARIDE IRON COMPLEX 150 MG PO CAPS: 150.0000 mg | ORAL_CAPSULE | Freq: Every day | ORAL | Status: AC

## 2020-12-15 MED ORDER — IRON SUCROSE 20 MG/ML IV SOLN
500.0000 mg | INTRAVENOUS | Status: DC
  Administered 2020-12-15: 500 mg via INTRAVENOUS
  Filled 2020-12-15 (×2): qty 25

## 2020-12-15 MED ORDER — IBUPROFEN 600 MG PO TABS: 600.0000 mg | ORAL_TABLET | Freq: Four times a day (QID) | ORAL | 0 refills | Status: AC

## 2020-12-15 MED ORDER — ACETAMINOPHEN 500 MG PO TABS: 1000.0000 mg | ORAL_TABLET | Freq: Four times a day (QID) | ORAL | 2 refills | Status: AC | PRN

## 2020-12-15 MED ORDER — BENZOCAINE-MENTHOL 20-0.5 % EX AERO: 1.0000 "application " | INHALATION_SPRAY | CUTANEOUS | Status: AC | PRN

## 2020-12-15 MED ORDER — MAGNESIUM OXIDE -MG SUPPLEMENT 400 (240 MG) MG PO TABS: 400.0000 mg | ORAL_TABLET | Freq: Every day | ORAL | Status: AC

## 2020-12-15 MED ORDER — COCONUT OIL OIL: 1.0000 "application " | TOPICAL_OIL | 0 refills | Status: AC | PRN

## 2020-12-15 NOTE — Progress Notes (Signed)
PPD # 1 S/P NSVD  Live born female  Birth Weight: 9 lb 2.2 oz (4145 g) APGAR: 8, 9  Newborn Delivery   Birth date/time: 12/14/2020 04:58:00 Delivery type: Vaginal, Spontaneous     Baby name: Zyiar Delivering provider: MODY, VAISHALI  Episiotomy:None   Lacerations:None   circumcision planned  Feeding: bottle  Pain control at delivery: Epidural   S:  Reports feeling well, desires DC home today if possible.             Tolerating po/ No nausea or vomiting             Bleeding is light             Pain controlled with ibuprofen (OTC)             Up ad lib / ambulatory / voiding without difficulties   O:  A & O x 3, in no apparent distress              VS:  Vitals:   12/14/20 1612 12/14/20 2015 12/14/20 2245 12/15/20 0630  BP: 98/69 102/65  91/60  Pulse: 97 87  85  Resp: 17 (!) 24 20 20   Temp: 98.3 F (36.8 C) 98.2 F (36.8 C)  97.8 F (36.6 C)  TempSrc: Oral Oral  Oral  SpO2: 97% 96%    Weight:      Height:        LABS:  Recent Labs    12/13/20 1009 12/15/20 0505  WBC 13.1* 13.3*  HGB 11.5* 8.0*  HCT 34.5* 24.9*  PLT 180 140*    Blood type: --/--/O POS (02/06 1005)  Rubella: Immune (07/22 0000)   I&O: I/O last 3 completed shifts: In: -  Out: 800 [Urine:400; Blood:400]          No intake/output data recorded.  Vaccines: TDaP          UTD         Flu             ?                    COVID-19 ?  Gen: AAO x 3, NAD  Abdomen: soft, non-tender, non-distended             Fundus: firm, non-tender, U-1  Perineum: intact  Lochia: small  Extremities: no edema, no calf pain or tenderness    A/P: PPD # 1 30 y.o., 01-16-1971   Principal Problem:   Postpartum care following vaginal delivery 2/7 Active Problems:   Encounter for elective induction of labor   SVD (spontaneous vaginal delivery)   Maternal anemia, with delivery - IDA w/ ABL  - severe anemia asymptomatic  - given chronic IDA will give IV iron today then continue oral supplement 4-6 wks PP  Doing well  - stable status  Routine post partum orders  May consider DC home this afternoon pending baby discharge    June, MSN, CNM 12/15/2020, 9:30 AM

## 2020-12-15 NOTE — Discharge Summary (Signed)
OB Discharge Summary  Patient Name: Kelly Collier DOB: Jan 21, 1991 MRN: [id number]  Date of admission: 12/13/2020 Delivering provider: MODY, VAISHALI   Admitting diagnosis: Encounter for elective induction of labor [Z34.90] SVD (spontaneous vaginal delivery) [O80] Intrauterine pregnancy: [redacted]w[redacted]d     Secondary diagnosis: Patient Active Problem List   Diagnosis Date Noted  . Maternal anemia, with delivery - IDA w/ ABL 12/15/2020  . SVD (spontaneous vaginal delivery) 12/14/2020  . Postpartum care following vaginal delivery 2/7 12/14/2020  . Encounter for elective induction of labor 12/13/2020   Additional problems:none   Date of discharge: 12/16/2020   Discharge diagnosis: Principal Problem:   Postpartum care following vaginal delivery 2/7 Active Problems:   Encounter for elective induction of labor   SVD (spontaneous vaginal delivery)   Maternal anemia, with delivery - IDA w/ ABL                                                              Post partum procedures:IV iron  Augmentation: AROM and Pitocin Pain control: Epidural  Laceration:None  Episiotomy:None  Complications: None  Hospital course:  Induction of Labor With Vaginal Delivery   30 y.o. yo [id number] at [redacted]w[redacted]d was admitted to the hospital 12/13/2020 for induction of labor.  Indication for induction: Hx LGA with shoulder dystocia.  Patient had an uncomplicated labor course as follows: Membrane Rupture Time/Date: 11:38 AM ,12/13/2020   Delivery Method:Vaginal, Spontaneous  Episiotomy: None  Lacerations:  None  Details of delivery can be found in separate delivery note.  Patient had a routine postpartum course. Patient is discharged home 12/16/20.  Newborn Data: Birth date:12/14/2020  Birth time:4:58 AM  Gender:Female  Living status:Living  Apgars:8 ,9  Weight:4145 g   Physical exam  Vitals:   12/15/20 0630 12/15/20 1401 12/15/20 2250 12/16/20 0505  BP: 91/60 99/66 119/78 100/75  Pulse: 85 86 87 85  Resp: 20 17 18 18    Temp: 97.8 F (36.6 C) 99.3 F (37.4 C) 98.3 F (36.8 C) 98.4 F (36.9 C)  TempSrc: Oral Oral Oral Oral  SpO2:  99% 95% 95%  Weight:      Height:       General: alert, cooperative and no distress Lochia: appropriate Uterine Fundus: firm Incision: N/A Perineum: repair intact, no edema DVT Evaluation: No cords or calf tenderness. No significant calf/ankle edema. Labs: Lab Results  Component Value Date   WBC 13.3 (H) 12/15/2020   HGB 8.0 (L) 12/15/2020   HCT 24.9 (L) 12/15/2020   MCV 94.3 12/15/2020   PLT 140 (L) 12/15/2020   No flowsheet data found. Edinburgh Postnatal Depression Scale Screening Tool 12/15/2020  I have been able to laugh and see the funny side of things. 1  I have looked forward with enjoyment to things. 1  I have blamed myself unnecessarily when things went wrong. 2  I have been anxious or worried for no good reason. 2  I have felt scared or panicky for no good reason. 1  Things have been getting on top of me. 2  I have been so unhappy that I have had difficulty sleeping. 3  I have felt sad or miserable. 2  I have been so unhappy that I have been crying. 2  The thought of harming myself has occurred to me. 0  Edinburgh Postnatal Depression Scale Total 16   Vaccines: TDaP          UTD         Flu             declined                    COVID-19 declined  Discharge instruction:  per After Visit Summary,  [facility] OB booklet and  "Understanding Mother & Baby Care" hospital booklet  After Visit Meds:  Allergies as of 12/16/2020   No Known Allergies     Medication List    TAKE these medications   acetaminophen 500 MG tablet Commonly known as: TYLENOL Take 2 tablets (1,000 mg total) by mouth every 6 (six) hours as needed.   benzocaine-Menthol 20-0.5 % Aero Commonly known as: DERMOPLAST Apply 1 application topically as needed for irritation (perineal discomfort).   coconut oil Oil Apply 1 application topically as needed.   ibuprofen 600 MG  tablet Commonly known as: ADVIL Take 1 tablet (600 mg total) by mouth every 6 (six) hours.   iron polysaccharides 150 MG capsule Commonly known as: Ferrex 150 Take 1 capsule (150 mg total) by mouth daily.   Magnesium Oxide 400 (240 Mg) MG Tabs Take 1 tablet (400 mg total) by mouth daily. For prevention of constipation.   prenatal multivitamin Tabs tablet Take 1 tablet by mouth daily at 12 noon.            Discharge Care Instructions  (From admission, onward)         Start     Ordered   12/15/20 0000  Discharge wound care:       Comments: Sitz baths 2 times /day with warm water x 1 week. May add herbals: 1 ounce dried comfrey leaf* 1 ounce calendula flowers 1 ounce lavender flowers  Supplies can be found online at [employer] sources at [employer], [employer]  1/2 ounce dried uva ursi leaves 1/2 ounce witch hazel blossoms (if you can find them) 1/2 ounce dried sage leaf 1/2 cup sea salt Directions: Bring 2 quarts of water to a boil. Turn off heat, and place 1 ounce (approximately 1 large handful) of the above mixed herbs (not the salt) into the pot. Steep, covered, for 30 minutes.  Strain the liquid well with a fine mesh strainer, and discard the herb material. Add 2 quarts of liquid to the tub, along with the 1/2 cup of salt. This medicinal liquid can also be made into compresses and peri-rinses.   12/15/20 1139          Diet: iron rich diet  Activity: Advance as tolerated. Pelvic rest for 6 weeks.   Postpartum contraception: Not Discussed  Newborn Data: Live born female  Birth Weight: 9 lb 2.2 oz (4145 g) APGAR: 8, 9  Newborn Delivery   Birth date/time: 12/14/2020 04:58:00 Delivery type: Vaginal, Spontaneous      named Zyiar Baby Feeding: Bottle Disposition:home with mother   Delivery Report:   Review the Delivery Report for details.    Follow up:  Follow-up Information    Shea Evans, MD. Schedule an appointment as soon as  possible for a visit in 6 week(s).   Specialty: Obstetrics and Gynecology Contact information: [address] [place] [address] [phone]                 Signed: Cipriano Mile, MSN 12/16/2020, 9:24 AM

## 2020-12-16 NOTE — Progress Notes (Signed)
CSW received consult due to score 16 on Edinburgh Depression Screen.    CSW congratulated MOB and FOB on the birth of infant. CSW advised the HIPPA policy in which MOB was agreeable to CSW having FOB leave room. CSW then advised MOB of CSW's role and the reason for CSW coming to speak with her. MOB expressed that she scored a 16 on Edinburgh due to "my dad past away 3 months ago". MOB expressed that she never mourned the loss of her dad and became sad due to "he won't get to meet the baby". CSW validated these feelings for MOB and offered her support. MOB declined grief counseling resources and reported that she has just been "dealing with it". CSW still encouraged MOB to take resources if ever needed. MOB thanked CSW and expressed no other needs. MOB denies SI, HI and DV to this CSW when asked and denies having any other mental health hx aside from anxiety and depression which MOB declined treatment for at this time.   MOB advised CSW that she has support from her mom and brother. MOB expressed that FOB is also supportive to MOB. MOB expressed that she has all needed items to care for infant and expressed that she has a basinet for infant to sleep in once arrived home.   CSW provided education regarding Baby Blues vs PMADs and provided MOB with resources for mental health follow up.  CSW encouraged MOB to evaluate her mental health throughout the postpartum period with the use of the New Mom Checklist developed by [facility] [employer] as well as the [place] Postnatal Depression Scale and notify a medical professional if symptoms arise.     Kelly Collier, MSW, LCSW [facility] [phone]
